# Patient Record
Sex: Female | Born: 1975 | Race: White | Hispanic: No | Marital: Married | Smoking: Never smoker
Health system: Southern US, Community
[De-identification: ages and names within clinical notes are randomized; demographics above are authoritative.]

## PROBLEM LIST (undated history)

## (undated) DIAGNOSIS — D649 Anemia, unspecified: Secondary | ICD-10-CM

## (undated) DIAGNOSIS — N6091 Unspecified benign mammary dysplasia of right breast: Secondary | ICD-10-CM

## (undated) DIAGNOSIS — A6000 Herpesviral infection of urogenital system, unspecified: Secondary | ICD-10-CM

## (undated) DIAGNOSIS — N6489 Other specified disorders of breast: Secondary | ICD-10-CM

## (undated) HISTORY — PX: FRACTURE SURGERY: SHX138

## (undated) HISTORY — DX: Herpesviral infection of urogenital system, unspecified: A60.00

---

## 1998-09-12 DIAGNOSIS — R87629 Unspecified abnormal cytological findings in specimens from vagina: Secondary | ICD-10-CM

## 1998-09-12 HISTORY — DX: Unspecified abnormal cytological findings in specimens from vagina: R87.629

## 1999-04-13 HISTORY — PX: OTHER SURGICAL HISTORY: SHX169

## 2007-01-13 ENCOUNTER — Inpatient Hospital Stay: Payer: Self-pay

## 2010-09-12 HISTORY — PX: DILATION AND CURETTAGE OF UTERUS: SHX78

## 2010-09-20 ENCOUNTER — Ambulatory Visit: Payer: Self-pay

## 2010-09-21 ENCOUNTER — Ambulatory Visit: Payer: Self-pay

## 2011-10-29 ENCOUNTER — Inpatient Hospital Stay: Payer: Self-pay

## 2011-10-29 LAB — CBC WITH DIFFERENTIAL/PLATELET
Basophil %: 0.3 %
Eosinophil %: 0.5 %
HGB: 11.8 g/dL — ABNORMAL LOW (ref 12.0–16.0)
MCH: 27.5 pg (ref 26.0–34.0)
MCV: 83 fL (ref 80–100)
Monocyte #: 0.4 10*3/uL (ref 0.0–0.7)
RBC: 4.28 10*6/uL (ref 3.80–5.20)

## 2014-11-04 LAB — HM PAP SMEAR

## 2019-11-29 ENCOUNTER — Encounter: Payer: Self-pay | Admitting: Obstetrics and Gynecology

## 2019-12-02 ENCOUNTER — Other Ambulatory Visit (HOSPITAL_COMMUNITY)
Admission: RE | Admit: 2019-12-02 | Discharge: 2019-12-02 | Disposition: A | Discharge status: Home / Self Care | Source: Ambulatory Visit | Attending: Obstetrics and Gynecology | Admitting: Obstetrics and Gynecology

## 2019-12-02 ENCOUNTER — Other Ambulatory Visit: Payer: Self-pay

## 2019-12-02 ENCOUNTER — Encounter: Payer: Self-pay | Admitting: Obstetrics and Gynecology

## 2019-12-02 ENCOUNTER — Ambulatory Visit (INDEPENDENT_AMBULATORY_CARE_PROVIDER_SITE_OTHER): Admitting: Obstetrics and Gynecology

## 2019-12-02 VITALS — BP 114/80 | Ht 64.0 in | Wt 155.0 lb

## 2019-12-02 DIAGNOSIS — Z01419 Encounter for gynecological examination (general) (routine) without abnormal findings: Secondary | ICD-10-CM

## 2019-12-02 DIAGNOSIS — Z124 Encounter for screening for malignant neoplasm of cervix: Secondary | ICD-10-CM

## 2019-12-02 DIAGNOSIS — Z1151 Encounter for screening for human papillomavirus (HPV): Secondary | ICD-10-CM | POA: Diagnosis present

## 2019-12-02 DIAGNOSIS — Z1231 Encounter for screening mammogram for malignant neoplasm of breast: Secondary | ICD-10-CM

## 2019-12-02 DIAGNOSIS — A6004 Herpesviral vulvovaginitis: Secondary | ICD-10-CM

## 2019-12-02 MED ORDER — VALACYCLOVIR HCL 500 MG PO TABS: 500.0000 mg | ORAL_TABLET | Freq: Two times a day (BID) | ORAL | 1 refills | Status: AC

## 2019-12-02 NOTE — Progress Notes (Signed)
PCP:  Patient, No Pcp Per   Chief Complaint  Patient presents with  . Gynecologic Exam    HPI:      [name] is a 44 y.o. No obstetric history on file. who LMP was Patient's last menstrual period was 11/22/2019 (approximate)., presents today for her NP > 3 yrs annual examination.  Her menses are regular every 28-30 days, lasting 5-7 days.  Dysmenorrhea mild, occurring first 1-2 days of flow. She does not have intermenstrual bleeding.  Sex activity: single partner, contraception - vasectomy.  Last Pap: November 04, 2014  Results were: no abnormalities /neg HPV DNA  Hx of STDs: HSV (takes valtrex prn sx, needs Rx RF), HPV on pap  Last mammogram: 12/14/15  Results were: normal--routine follow-up in 12 months There is no FH of breast cancer. There is no FH of ovarian cancer. The patient does not do self-breast exams.  Tobacco use: The patient denies current or previous tobacco use. Alcohol use: social drinker No drug use.  Exercise: moderately active  She does get adequate calcium and Vitamin D in her diet. No recent labs but has NP PCP appt next month.  Past Medical History:  Diagnosis Date  . Abnormal vaginal Pap smear 2000  . Herpes genitalis     Past Surgical History:  Procedure Laterality Date  . Cervical cryotherapy  04/13/1999  . DILATION AND CURETTAGE OF UTERUS  09/2010    Family History  Problem Relation Age of Onset  . Diabetes Maternal Grandmother   . Diabetes Other   . Colon cancer Paternal Grandfather        35-72    Social History   Socioeconomic History  . Marital status: Married    Spouse name: Not on file  . Number of children: Not on file  . Years of education: Not on file  . Highest education level: Not on file  Occupational History  . Not on file  Tobacco Use  . Smoking status: Never Smoker  . Smokeless tobacco: Never Used  Substance and Sexual Activity  . Alcohol use: Yes  . Drug use: Never  . Sexual activity: Yes    Birth  control/protection: None, Surgical    Comment: Vasectomy  Other Topics Concern  . Not on file  Social History Narrative  . Not on file   Social Determinants of Health   Financial Resource Strain:   . Difficulty of Paying Living Expenses:   Food Insecurity:   . Worried About Charity fundraiser in the Last Year:   . Arboriculturist in the Last Year:   Transportation Needs:   . Film/video editor (Medical):   Marland Kitchen Lack of Transportation (Non-Medical):   Physical Activity:   . Days of Exercise per Week:   . Minutes of Exercise per Session:   Stress:   . Feeling of Stress :   Social Connections:   . Frequency of Communication with Friends and Family:   . Frequency of Social Gatherings with Friends and Family:   . Attends Religious Services:   . Active Member of Clubs or Organizations:   . Attends Archivist Meetings:   Marland Kitchen Marital Status:   Intimate Partner Violence:   . Fear of Current or Ex-Partner:   . Emotionally Abused:   Marland Kitchen Physically Abused:   . Sexually Abused:      Current Outpatient Medications:  .  valACYclovir (VALTREX) 500 MG tablet, Take 1 tablet (500 mg total) by  mouth 2 (two) times daily for 3 days. Prn sx, Disp: 30 tablet, Rfl: 1     ROS:  Review of Systems  Constitutional: Negative for fatigue, fever and unexpected weight change.  Respiratory: Negative for cough, shortness of breath and wheezing.   Cardiovascular: Negative for chest pain, palpitations and leg swelling.  Gastrointestinal: Negative for blood in stool, constipation, diarrhea, nausea and vomiting.  Endocrine: Negative for cold intolerance, heat intolerance and polyuria.  Genitourinary: Negative for dyspareunia, dysuria, flank pain, frequency, genital sores, hematuria, menstrual problem, pelvic pain, urgency, vaginal bleeding, vaginal discharge and vaginal pain.  Musculoskeletal: Negative for back pain, joint swelling and myalgias.  Skin: Negative for rash.  Neurological:  Negative for dizziness, syncope, light-headedness, numbness and headaches.  Hematological: Negative for adenopathy.  Psychiatric/Behavioral: Negative for agitation, confusion, sleep disturbance and suicidal ideas. The patient is not nervous/anxious.   BREAST: No symptoms   Objective: BP 114/80   Ht 5\' 4"  (1.626 m)   Wt 155 lb (70.3 kg)   LMP 11/22/2019 (Approximate)   BMI 26.61 kg/m    Physical Exam Constitutional:      Appearance: She is well-developed.  Genitourinary:     Vulva, vagina, cervix, uterus, right adnexa and left adnexa normal.     No vulval lesion or tenderness noted.     No vaginal discharge, erythema or tenderness.     No cervical polyp.     Uterus is not enlarged or tender.     No right or left adnexal mass present.     Right adnexa not tender.     Left adnexa not tender.  Neck:     Thyroid: No thyromegaly.  Cardiovascular:     Rate and Rhythm: Normal rate and regular rhythm.     Heart sounds: Normal heart sounds. No murmur.  Pulmonary:     Effort: Pulmonary effort is normal.     Breath sounds: Normal breath sounds.  Chest:     Breasts:        Right: No mass, nipple discharge, skin change or tenderness.        Left: No mass, nipple discharge, skin change or tenderness.  Abdominal:     Palpations: Abdomen is soft.     Tenderness: There is no abdominal tenderness. There is no guarding.  Musculoskeletal:        General: Normal range of motion.     Cervical back: Normal range of motion.  Neurological:     General: No focal deficit present.     Mental Status: She is alert and oriented to person, place, and time.     Cranial Nerves: No cranial nerve deficit.  Skin:    General: Skin is warm and dry.  Psychiatric:        Mood and Affect: Mood normal.        Behavior: Behavior normal.        Thought Content: Thought content normal.        Judgment: Judgment normal.  Vitals reviewed.     Assessment/Plan: Encounter for annual routine gynecological  examination  Cervical cancer screening - Plan: Cytology - PAP  Screening for HPV (human papillomavirus) - Plan: Cytology - PAP  Encounter for screening mammogram for malignant neoplasm of breast - Plan: MM 3D SCREEN BREAST BILATERAL; pt to sched mammo  Herpes simplex vulvovaginitis - Plan: valACYclovir (VALTREX) 500 MG tablet; Rx RF.   Meds ordered this encounter  Medications  . valACYclovir (VALTREX) 500 MG tablet    Sig:  Take 1 tablet (500 mg total) by mouth 2 (two) times daily for 3 days. Prn sx    Dispense:  30 tablet    Refill:  1    Order Specific Question:   Supervising Provider    Answer:   Gae Dry [id number]             GYN counsel mammography screening, adequate intake of calcium and vitamin D, diet and exercise     F/U  Return in about 1 year (around 12/01/2020).  Alicia B. Copland, PA-C 12/02/2019 3:35 PM

## 2019-12-02 NOTE — Patient Instructions (Signed)
I value your feedback and entrusting us with your care. If you get a [place] patient survey, I would appreciate you taking the time to let us know about your experience today. Thank you!  As of August 22, 2019, your lab results will be released to your MyChart immediately, before I even have a chance to see them. Please give me time to review them and contact you if there are any abnormalities. Thank you for your patience.   [facility]: [phone]  [facility]: [phone]  

## 2019-12-04 LAB — CYTOLOGY - PAP
Comment: NEGATIVE
Diagnosis: NEGATIVE
High risk HPV: NEGATIVE

## 2019-12-16 ENCOUNTER — Ambulatory Visit
Admission: RE | Admit: 2019-12-16 | Discharge: 2019-12-16 | Disposition: A | Discharge status: Home / Self Care | Source: Ambulatory Visit | Attending: Obstetrics and Gynecology | Admitting: Obstetrics and Gynecology

## 2019-12-16 DIAGNOSIS — Z1231 Encounter for screening mammogram for malignant neoplasm of breast: Secondary | ICD-10-CM | POA: Insufficient documentation

## 2019-12-25 ENCOUNTER — Encounter: Payer: Self-pay | Admitting: Obstetrics and Gynecology

## 2020-01-07 ENCOUNTER — Other Ambulatory Visit: Payer: Self-pay | Admitting: Orthopedic Surgery

## 2020-01-07 DIAGNOSIS — M25531 Pain in right wrist: Secondary | ICD-10-CM

## 2020-01-07 DIAGNOSIS — G8929 Other chronic pain: Secondary | ICD-10-CM

## 2020-01-27 ENCOUNTER — Ambulatory Visit
Admission: RE | Admit: 2020-01-27 | Discharge: 2020-01-27 | Disposition: A | Discharge status: Home / Self Care | Source: Ambulatory Visit | Attending: Orthopedic Surgery | Admitting: Orthopedic Surgery

## 2020-01-27 ENCOUNTER — Other Ambulatory Visit: Payer: Self-pay

## 2020-01-27 DIAGNOSIS — M25531 Pain in right wrist: Secondary | ICD-10-CM

## 2020-01-27 DIAGNOSIS — R936 Abnormal findings on diagnostic imaging of limbs: Secondary | ICD-10-CM | POA: Diagnosis not present

## 2020-01-27 DIAGNOSIS — G8929 Other chronic pain: Secondary | ICD-10-CM | POA: Diagnosis not present

## 2020-01-27 MED ORDER — IOHEXOL 180 MG/ML  SOLN
2.0000 mL | Freq: Once | INTRAMUSCULAR | Status: AC
  Administered 2020-01-27: 2 mL

## 2020-01-27 MED ORDER — LIDOCAINE HCL (PF) 1 % IJ SOLN
5.0000 mL | Freq: Once | INTRAMUSCULAR | Status: AC
  Administered 2020-01-27: 5 mL
  Filled 2020-01-27: qty 5

## 2020-01-27 MED ORDER — SODIUM CHLORIDE (PF) 0.9 % IJ SOLN
10.0000 mL | Freq: Once | INTRAMUSCULAR | Status: AC
  Administered 2020-01-27: 10 mL

## 2020-01-27 MED ORDER — GADOBUTROL 1 MMOL/ML IV SOLN
0.0500 mL | Freq: Once | INTRAVENOUS | Status: AC
  Administered 2020-01-27: 0.05 mL

## 2020-03-09 ENCOUNTER — Other Ambulatory Visit: Payer: Self-pay

## 2020-03-09 ENCOUNTER — Ambulatory Visit (INDEPENDENT_AMBULATORY_CARE_PROVIDER_SITE_OTHER): Admitting: Dermatology

## 2020-03-09 DIAGNOSIS — L578 Other skin changes due to chronic exposure to nonionizing radiation: Secondary | ICD-10-CM | POA: Diagnosis not present

## 2020-03-09 DIAGNOSIS — L820 Inflamed seborrheic keratosis: Secondary | ICD-10-CM

## 2020-03-09 DIAGNOSIS — D485 Neoplasm of uncertain behavior of skin: Secondary | ICD-10-CM

## 2020-03-09 DIAGNOSIS — L918 Other hypertrophic disorders of the skin: Secondary | ICD-10-CM

## 2020-03-09 DIAGNOSIS — L814 Other melanin hyperpigmentation: Secondary | ICD-10-CM

## 2020-03-09 DIAGNOSIS — Z86018 Personal history of other benign neoplasm: Secondary | ICD-10-CM

## 2020-03-09 HISTORY — DX: Personal history of other benign neoplasm: Z86.018

## 2020-03-09 NOTE — Patient Instructions (Signed)

## 2020-03-09 NOTE — Progress Notes (Signed)
Follow-Up Visit   Subjective  Penny Tapia is a 44 y.o. female who presents for the following: Other (Spot of ant neck ~20. it sometimes itches and get irritated by jewelry.) and Skin Tag (of bilat axilla. Get irritated with shaving and by clothing.).  The following portions of the chart were reviewed this encounter and updated as appropriate:  Tobacco  Allergies  Meds  Problems  Med Hx  Surg Hx  Fam Hx      Review of Systems:  No other skin or systemic complaints except as noted in HPI or Assessment and Plan.  Objective  Well appearing patient in no apparent distress; mood and affect are within normal limits.  A focused examination was performed including face, neck, chest, bilat axilla. Relevant physical exam findings are noted in the Assessment and Plan.  Objective  Left Anterior Neck: 0.6 cm flesh colored papule  Objective  Right medial side of foot: 0.5 cm irregular brown macule  Objective  Left Axilla, Right Axilla: Fleshy, skin-colored pedunculated papules.    Objective  Right Breast: Erythematous keratotic or waxy stuck-on papule or plaque.    Assessment & Plan    Actinic Damage - diffuse scaly erythematous macules with underlying dyspigmentation - Recommend daily broad spectrum sunscreen SPF 30+ to sun-exposed areas, reapply every 2 hours as needed.  - Call for new or changing lesions.  Lentigines - Scattered tan macules - Discussed due to sun exposure - Benign, observe - Call for any changes  Neoplasm of uncertain behavior of skin (2) Left Anterior Neck  Epidermal / dermal shaving  Lesion diameter (cm):  0.6 Informed consent: discussed and consent obtained   Timeout: patient name, date of birth, surgical site, and procedure verified   Procedure prep:  Patient was prepped and draped in usual sterile fashion Prep type:  Isopropyl alcohol Anesthesia: the lesion was anesthetized in a standard fashion   Anesthetic:  1% lidocaine w/  epinephrine 1-100,000 buffered w/ 8.4% NaHCO3 Instrument used: flexible razor blade   Hemostasis achieved with: pressure, aluminum chloride and electrodesiccation   Outcome: patient tolerated procedure well   Post-procedure details: sterile dressing applied and wound care instructions given   Dressing type: bandage and petrolatum   Additional details:  Post treatment defect - 1.0 cm  Specimen 1 - Surgical pathology Differential Diagnosis:Nevus vs dysplastic nevus  Check Margins: No 0.6 cm flesh colored papule  Right medial side of foot  Epidermal / dermal shaving  Lesion diameter (cm):  0.5 Informed consent: discussed and consent obtained   Timeout: patient name, date of birth, surgical site, and procedure verified   Procedure prep:  Patient was prepped and draped in usual sterile fashion Prep type:  Isopropyl alcohol Anesthesia: the lesion was anesthetized in a standard fashion   Anesthetic:  1% lidocaine w/ epinephrine 1-100,000 buffered w/ 8.4% NaHCO3 Instrument used: flexible razor blade   Hemostasis achieved with: pressure, aluminum chloride and electrodesiccation   Outcome: patient tolerated procedure well   Post-procedure details: sterile dressing applied and wound care instructions given   Dressing type: bandage and petrolatum   Additional details:  Post treatment defect - 0.9 cm  Specimen 2 - Surgical pathology Differential Diagnosis: Nevus vs dysplastic nevus Check Margins: No 0.5 cm irregular brown macule  Skin tag (2) Left Axilla; Right Axilla  Epidermal / dermal shaving - Left Axilla, Right Axilla  Informed consent: discussed and consent obtained   Patient was prepped and draped in usual sterile fashion: Area prepped with  alcohol. Anesthesia: the lesion was anesthetized in a standard fashion   Anesthetic:  1% lidocaine w/ epinephrine 1-100,000 buffered w/ 8.4% NaHCO3 Instrument used: scissors   Hemostasis achieved with: pressure, aluminum chloride and  electrodesiccation   Outcome: patient tolerated procedure well   Post-procedure details: wound care instructions given    Inflamed seborrheic keratosis Right Breast  Destruction of lesion - Right Breast Complexity: simple   Destruction method: cryotherapy   Informed consent: discussed and consent obtained   Timeout:  patient name, date of birth, surgical site, and procedure verified Lesion destroyed using liquid nitrogen: Yes   Region frozen until ice ball extended beyond lesion: Yes   Outcome: patient tolerated procedure well with no complications   Post-procedure details: wound care instructions given    Return if symptoms worsen or fail to improve.   I, Ashok Cordia, CMA, am acting as scribe for Sarina Ser, MD .  Documentation: I have reviewed the above documentation for accuracy and completeness, and I agree with the above.  Sarina Ser, MD

## 2020-03-12 ENCOUNTER — Telehealth: Payer: Self-pay

## 2020-03-12 ENCOUNTER — Encounter: Payer: Self-pay | Admitting: Dermatology

## 2020-03-12 NOTE — Telephone Encounter (Signed)
Patient informed of pathology results and appointment scheduled.  °

## 2020-03-12 NOTE — Telephone Encounter (Signed)
-----   Message from Ralene Bathe, MD sent at 03/12/2020  8:56 AM EDT ----- 1. Skin , left anterior neck, shave MELANOCYTIC NEVUS, INTRADERMAL TYPE 2. Skin , right medial side of foot JUNCTIONAL DYSPLASTIC MELANOCYTIC NEVUS WITH MODERATE TO SEVERE ATYPIA, PERIPHERAL MARGIN INVOLVED, SEE DESCRIPTION  1- benign mole 2- Moderate to severe dysplastic nevus Schedule for shave removal

## 2020-06-02 ENCOUNTER — Ambulatory Visit (INDEPENDENT_AMBULATORY_CARE_PROVIDER_SITE_OTHER): Admitting: Dermatology

## 2020-06-02 ENCOUNTER — Encounter: Payer: Self-pay | Admitting: Dermatology

## 2020-06-02 ENCOUNTER — Other Ambulatory Visit: Payer: Self-pay

## 2020-06-02 DIAGNOSIS — D1801 Hemangioma of skin and subcutaneous tissue: Secondary | ICD-10-CM | POA: Diagnosis not present

## 2020-06-02 DIAGNOSIS — D235 Other benign neoplasm of skin of trunk: Secondary | ICD-10-CM

## 2020-06-02 DIAGNOSIS — D239 Other benign neoplasm of skin, unspecified: Secondary | ICD-10-CM

## 2020-06-02 NOTE — Patient Instructions (Signed)

## 2020-06-02 NOTE — Progress Notes (Signed)
   Follow-Up Visit   Subjective  Penny Tapia is a 44 y.o. female who presents for the following: Dysplastic Nevus moderate to severe (R medial side of foot, bx 03/09/20). She also has spots on her trunk she would like checked.  The following portions of the chart were reviewed this encounter and updated as appropriate:  Tobacco  Allergies  Meds  Problems  Med Hx  Surg Hx  Fam Hx     Review of Systems:  No other skin or systemic complaints except as noted in HPI or Assessment and Plan.  Objective  Well appearing patient in no apparent distress; mood and affect are within normal limits.  A focused examination was performed including R medial side of foot. Relevant physical exam findings are noted in the Assessment and Plan.  Objective  R medial side of foot: Pink bx site 1.5cm  Objective  abdomen: Red papules.    Assessment & Plan  Dysplastic nevus R medial side of foot  Moderate to severe, bx proven  Shave removal today  Epidermal / dermal shaving - R medial side of foot  Lesion diameter (cm):  1.5 Informed consent: discussed and consent obtained   Timeout: patient name, date of birth, surgical site, and procedure verified   Procedure prep:  Patient was prepped and draped in usual sterile fashion Prep type:  Isopropyl alcohol Anesthesia: the lesion was anesthetized in a standard fashion   Anesthetic:  1% lidocaine w/ epinephrine 1-100,000 buffered w/ 8.4% NaHCO3 Instrument used: flexible razor blade   Hemostasis achieved with: pressure, aluminum chloride and electrodesiccation   Outcome: patient tolerated procedure well   Post-procedure details: sterile dressing applied and wound care instructions given   Dressing type: bandage, pressure dressing and bacitracin    Specimen 1 - Surgical pathology Differential Diagnosis: D48.5 Dysplastic Nevus Check Margins: yes Pink bx site 1.5cm [id number]  Hemangioma of skin abdomen  Benign, observe  Return in  about 6 months (around 11/30/2020) for TBSE, hx of dysplastic nevi.  I, Othelia Pulling, RMA, am acting as scribe for Sarina Ser, MD .  Documentation: I have reviewed the above documentation for accuracy and completeness, and I agree with the above.  Sarina Ser, MD

## 2020-06-03 ENCOUNTER — Encounter: Payer: Self-pay | Admitting: Dermatology

## 2020-06-08 ENCOUNTER — Telehealth: Payer: Self-pay

## 2020-06-08 NOTE — Telephone Encounter (Signed)
Discussed biopsy results with pt  °

## 2020-06-08 NOTE — Telephone Encounter (Signed)
-----   Message from Ralene Bathe, MD sent at 06/05/2020  6:08 PM EDT ----- Skin , right medial side of foot NO RESIDUAL DYSPLASTIC NEVUS, MARGINS FREE  Moderate to severe dysplastic nevus Margins clear No further treatment at this time Recheck next visit

## 2020-12-03 NOTE — Patient Instructions (Signed)
I value your feedback and you entrusting us with your care. If you get a [place] patient survey, I would appreciate you taking the time to let us know about your experience today. Thank you!  [facility]: [phone]      

## 2020-12-03 NOTE — Progress Notes (Signed)
PCP:  [place] Pink, MD   Chief Complaint  Patient presents with  . Gynecologic Exam    No concerns    HPI:      [name] is a 45 y.o. No obstetric history on file. who LMP was Patient's last menstrual period was 11/13/2020 (approximate)., presents today for her annual examination.  Her menses are regular every 28-30 days, lasting 5-7 days.  Dysmenorrhea mild, occurring first 1-2 days of flow. She does not have intermenstrual bleeding.  Sex activity: single partner, contraception - vasectomy.  Last Pap: 12/02/19 Results were: no abnormalities /neg HPV DNA  Hx of STDs: HSV a few times yearly (takes valtrex prn sx, needs Rx RF), HPV on pap  Last mammogram: 12/16/19 Results were: normal--routine follow-up in 12 months There is no FH of breast cancer. There is no FH of ovarian cancer. The patient does not do self-breast exams.  Tobacco use: The patient denies current or previous tobacco use. Alcohol use: social drinker No drug use.  Exercise: moderately active  She does get adequate calcium and Vitamin D in her diet. Labs with PCP  Past Medical History:  Diagnosis Date  . Abnormal vaginal Pap smear 2000  . Herpes genitalis   . Hx of dysplastic nevus 03/09/2020   R medial side of foot. Moderate to severe atypia, peripheral margin involved. Excised 06/02/2020. Margins free.    Past Surgical History:  Procedure Laterality Date  . Cervical cryotherapy  04/13/1999  . DILATION AND CURETTAGE OF UTERUS  09/2010    Family History  Problem Relation Age of Onset  . Diabetes Maternal Grandmother   . Diabetes Other   . Colon cancer Paternal Grandfather        54-72  . Breast cancer Neg Hx     Social History   Socioeconomic History  . Marital status: Married    Spouse name: Not on file  . Number of children: Not on file  . Years of education: Not on file  . Highest education level: Not on file  Occupational History  . Not on file  Tobacco Use  . Smoking status:  Never Smoker  . Smokeless tobacco: Never Used  Vaping Use  . Vaping Use: Never used  Substance and Sexual Activity  . Alcohol use: Yes  . Drug use: Never  . Sexual activity: Yes    Birth control/protection: None, Surgical    Comment: Vasectomy  Other Topics Concern  . Not on file  Social History Narrative  . Not on file   Social Determinants of Health   Financial Resource Strain: Not on file  Food Insecurity: Not on file  Transportation Needs: Not on file  Physical Activity: Not on file  Stress: Not on file  Social Connections: Not on file  Intimate Partner Violence: Not on file     Current Outpatient Medications:  .  Cholecalciferol 25 MCG (1000 UT) tablet, Take by mouth., Disp: , Rfl:  .  Multiple Vitamin (MULTI-VITAMIN) tablet, Take 1 tablet by mouth daily., Disp: , Rfl:  .  valACYclovir (VALTREX) 500 MG tablet, Take 1 tablet (500 mg total) by mouth 2 (two) times daily., Disp: 30 tablet, Rfl: 1     ROS:  Review of Systems  Constitutional: Negative for fatigue, fever and unexpected weight change.  Respiratory: Negative for cough, shortness of breath and wheezing.   Cardiovascular: Negative for chest pain, palpitations and leg swelling.  Gastrointestinal: Negative for blood in stool, constipation, diarrhea, nausea and vomiting.  Endocrine: Negative for cold intolerance, heat intolerance and polyuria.  Genitourinary: Negative for dyspareunia, dysuria, flank pain, frequency, genital sores, hematuria, menstrual problem, pelvic pain, urgency, vaginal bleeding, vaginal discharge and vaginal pain.  Musculoskeletal: Negative for back pain, joint swelling and myalgias.  Skin: Negative for rash.  Neurological: Negative for dizziness, syncope, light-headedness, numbness and headaches.  Hematological: Negative for adenopathy.  Psychiatric/Behavioral: Negative for agitation, confusion, sleep disturbance and suicidal ideas. The patient is not nervous/anxious.   BREAST: No  symptoms   Objective: BP 102/80   Ht 5\' 4"  (1.626 m)   Wt 156 lb (70.8 kg)   LMP 11/13/2020 (Approximate)   BMI 26.78 kg/m    Physical Exam Constitutional:      Appearance: She is well-developed.  Genitourinary:     Vulva normal.     Right Labia: No rash, tenderness or lesions.    Left Labia: No tenderness, lesions or rash.    No vaginal discharge, erythema or tenderness.      Right Adnexa: not tender and no mass present.    Left Adnexa: not tender and no mass present.    No cervical friability or polyp.     Uterus is not enlarged or tender.  Breasts:     Right: No mass, nipple discharge, skin change or tenderness.     Left: No mass, nipple discharge, skin change or tenderness.    Neck:     Thyroid: No thyromegaly.  Cardiovascular:     Rate and Rhythm: Normal rate and regular rhythm.     Heart sounds: Normal heart sounds. No murmur heard.   Pulmonary:     Effort: Pulmonary effort is normal.     Breath sounds: Normal breath sounds.  Abdominal:     Palpations: Abdomen is soft.     Tenderness: There is no abdominal tenderness. There is no guarding or rebound.  Musculoskeletal:        General: Normal range of motion.     Cervical back: Normal range of motion.  Lymphadenopathy:     Cervical: No cervical adenopathy.  Neurological:     General: No focal deficit present.     Mental Status: She is alert and oriented to person, place, and time.     Cranial Nerves: No cranial nerve deficit.  Skin:    General: Skin is warm and dry.  Psychiatric:        Mood and Affect: Mood normal.        Behavior: Behavior normal.        Thought Content: Thought content normal.        Judgment: Judgment normal.  Vitals reviewed.     Assessment/Plan: Encounter for annual routine gynecological examination  Encounter for screening mammogram for malignant neoplasm of breast - Plan: MM 3D SCREEN BREAST BILATERAL; pt to sched mammo  Herpes simplex vulvovaginitis - Plan: valACYclovir  (VALTREX) 500 MG tablet; Rx RF prn sx.   Meds ordered this encounter  Medications  . valACYclovir (VALTREX) 500 MG tablet    Sig: Take 1 tablet (500 mg total) by mouth 2 (two) times daily.    Dispense:  30 tablet    Refill:  1    Order Specific Question:   Supervising Provider    Answer:   Gae Dry [[id number]             GYN counsel mammography screening, adequate intake of calcium and vitamin D, diet and exercise     F/U  Return in about 1  year (around 12/07/2021).  Ravenna Legore B. Jerel Sardina, PA-C 12/07/2020 11:51 AM

## 2020-12-07 ENCOUNTER — Other Ambulatory Visit: Payer: Self-pay

## 2020-12-07 ENCOUNTER — Encounter: Admitting: Dermatology

## 2020-12-07 ENCOUNTER — Encounter: Payer: Self-pay | Admitting: Obstetrics and Gynecology

## 2020-12-07 ENCOUNTER — Ambulatory Visit (INDEPENDENT_AMBULATORY_CARE_PROVIDER_SITE_OTHER): Admitting: Obstetrics and Gynecology

## 2020-12-07 VITALS — BP 102/80 | Ht 64.0 in | Wt 156.0 lb

## 2020-12-07 DIAGNOSIS — A6004 Herpesviral vulvovaginitis: Secondary | ICD-10-CM

## 2020-12-07 DIAGNOSIS — Z01419 Encounter for gynecological examination (general) (routine) without abnormal findings: Secondary | ICD-10-CM

## 2020-12-07 DIAGNOSIS — Z1231 Encounter for screening mammogram for malignant neoplasm of breast: Secondary | ICD-10-CM | POA: Diagnosis not present

## 2020-12-07 MED ORDER — VALACYCLOVIR HCL 500 MG PO TABS: 500.0000 mg | ORAL_TABLET | Freq: Two times a day (BID) | ORAL | 1 refills | Status: DC

## 2020-12-21 ENCOUNTER — Ambulatory Visit
Admission: RE | Admit: 2020-12-21 | Discharge: 2020-12-21 | Disposition: A | Discharge status: Home / Self Care | Source: Ambulatory Visit | Attending: Obstetrics and Gynecology | Admitting: Obstetrics and Gynecology

## 2020-12-21 ENCOUNTER — Other Ambulatory Visit: Payer: Self-pay

## 2020-12-21 DIAGNOSIS — Z1231 Encounter for screening mammogram for malignant neoplasm of breast: Secondary | ICD-10-CM | POA: Insufficient documentation

## 2021-01-27 ENCOUNTER — Other Ambulatory Visit: Payer: Self-pay

## 2021-01-27 ENCOUNTER — Encounter: Payer: Self-pay | Admitting: Dermatology

## 2021-01-27 ENCOUNTER — Ambulatory Visit (INDEPENDENT_AMBULATORY_CARE_PROVIDER_SITE_OTHER): Admitting: Dermatology

## 2021-01-27 DIAGNOSIS — L578 Other skin changes due to chronic exposure to nonionizing radiation: Secondary | ICD-10-CM

## 2021-01-27 DIAGNOSIS — Z1283 Encounter for screening for malignant neoplasm of skin: Secondary | ICD-10-CM

## 2021-01-27 DIAGNOSIS — L308 Other specified dermatitis: Secondary | ICD-10-CM

## 2021-01-27 DIAGNOSIS — D229 Melanocytic nevi, unspecified: Secondary | ICD-10-CM

## 2021-01-27 DIAGNOSIS — L814 Other melanin hyperpigmentation: Secondary | ICD-10-CM

## 2021-01-27 DIAGNOSIS — L219 Seborrheic dermatitis, unspecified: Secondary | ICD-10-CM | POA: Diagnosis not present

## 2021-01-27 DIAGNOSIS — Z86018 Personal history of other benign neoplasm: Secondary | ICD-10-CM | POA: Diagnosis not present

## 2021-01-27 DIAGNOSIS — D1800 Hemangioma unspecified site: Secondary | ICD-10-CM

## 2021-01-27 DIAGNOSIS — L821 Other seborrheic keratosis: Secondary | ICD-10-CM

## 2021-01-27 NOTE — Progress Notes (Signed)
Follow-Up Visit   Subjective  Penny Tapia is a 45 y.o. female who presents for the following: Annual Exam (Mole check ) and Rash (Pt c/o rash on her scalp and eyes that will come and go, treating with Ketoconazole shampoo with a fair response ). Hx of Dysplastic nevus right foot.  The patient presents for Total-Body Skin Exam (TBSE) for skin cancer screening and mole check.  The following portions of the chart were reviewed this encounter and updated as appropriate:   Tobacco  Allergies  Meds  Problems  Med Hx  Surg Hx  Fam Hx     Review of Systems:  No other skin or systemic complaints except as noted in HPI or Assessment and Plan.  Objective  Well appearing patient in no apparent distress; mood and affect are within normal limits.  A full examination was performed including scalp, head, eyes, ears, nose, lips, neck, chest, axillae, abdomen, back, buttocks, bilateral upper extremities, bilateral lower extremities, hands, feet, fingers, toes, fingernails, and toenails. All findings within normal limits unless otherwise noted below.  Objective  Head - Anterior (Face): Pink patches with greasy scale.   Objective  trunk, exts: Clear today    Assessment & Plan  Seborrheic dermatitis Head - Anterior (Face) Seborrheic Dermatitis  -  is a chronic persistent rash characterized by pinkness and scaling most commonly of the mid face but also can occur on the scalp (dandruff), ears; mid chest and mid back. It tends to be exacerbated by stress and cooler weather.  People who have neurologic disease may experience new onset or exacerbation of existing seborrheic dermatitis.  The condition is not curable but treatable and can be controlled.   Cont Ketoconazole shampoo alternating with otc Zinc shampoo, over the counter Tar shampoo  May consider adding Clobetasol shampoo if no better, pt will call if she need this prescription   Other eczema trunk, exts Atopic dermatitis (eczema)  is a chronic, relapsing, pruritic condition that can significantly affect quality of life. It is often associated with allergic rhinitis and/or asthma and can require treatment with topical medications, phototherapy, or in severe cases a biologic medication called Dupixent in older children and adults.   Controlled on Cerave cream   May consider topical steroid cream if any flares, pt will return to the clinic if she need a topical steroid  Skin cancer screening   Lentigines - Scattered tan macules - Due to sun exposure - Benign-appering, observe - Recommend daily broad spectrum sunscreen SPF 30+ to sun-exposed areas, reapply every 2 hours as needed. - Call for any changes  Seborrheic Keratoses - Stuck-on, waxy, tan-brown papules and/or plaques  - Benign-appearing - Discussed benign etiology and prognosis. - Observe - Call for any changes  Melanocytic Nevi - Tan-brown and/or pink-flesh-colored symmetric macules and papules - Benign appearing on exam today - Observation - Call clinic for new or changing moles - Recommend daily use of broad spectrum spf 30+ sunscreen to sun-exposed areas.   Hemangiomas - Red papules - Discussed benign nature - Observe - Call for any changes  Actinic Damage - Chronic condition, secondary to cumulative UV/sun exposure - diffuse scaly erythematous macules with underlying dyspigmentation - Recommend daily broad spectrum sunscreen SPF 30+ to sun-exposed areas, reapply every 2 hours as needed.  - Staying in the shade or wearing long sleeves, sun glasses (UVA+UVB protection) and wide brim hats (4-inch brim around the entire circumference of the hat) are also recommended for sun protection.  -  Call for new or changing lesions.  History of Dysplastic Nevi Right foot 2021  - No evidence of recurrence today - Recommend regular full body skin exams - Recommend daily broad spectrum sunscreen SPF 30+ to sun-exposed areas, reapply every 2 hours as  needed.  - Call if any new or changing lesions are noted between office visits  Skin cancer screening performed today.  Return in about 1 year (around 01/27/2022) for TBSE, hx of Dysplastic nevus .  IMarye Round, CMA, am acting as scribe for Sarina Ser, MD .  Documentation: I have reviewed the above documentation for accuracy and completeness, and I agree with the above.  Sarina Ser, MD

## 2021-01-27 NOTE — Patient Instructions (Addendum)
  Recommend over the counter Tar shampoo, Zinc shampoo, alternating with Ketoconazole shampoo  If you have any questions or concerns for your doctor, please call our main line at [phone] and press option 4 to reach your doctor's medical assistant. If no one answers, please leave a voicemail as directed and we will return your call as soon as possible. Messages left after 4 pm will be answered the following business day.   You may also send [place] a message via [place]. We typically respond to MyChart messages within 1-2 business days.  For prescription refills, please ask your pharmacy to contact our office. Our fax number is [phone].  If you have an urgent issue when the clinic is closed that cannot wait until the next business day, you can page your doctor at the number below.    Please note that while we do our best to be available for urgent issues outside of office hours, we are not available 24/7.   If you have an urgent issue and are unable to reach [place], you may choose to seek medical care at your doctor's office, retail clinic, urgent care center, or emergency room.  If you have a medical emergency, please immediately call 911 or go to the emergency department.  Pager Numbers  - Dr. Nehemiah Massed: [phone]  - Dr. Laurence Ferrari: [phone]  - Dr. Nicole Kindred: [phone]  In the event of inclement weather, please call our main line at [phone] for an update on the status of any delays or closures.  Dermatology Medication Tips: Please keep the boxes that topical medications come in in order to help keep track of the instructions about where and how to use these. Pharmacies typically print the medication instructions only on the boxes and not directly on the medication tubes.   If your medication is too expensive, please contact our office at [phone] option 4 or send [place] a message through [place].   We are unable to tell what your co-pay for medications will be in advance as  this is different depending on your insurance coverage. However, we may be able to find a substitute medication at lower cost or fill out paperwork to get insurance to cover a needed medication.   If a prior authorization is required to get your medication covered by your insurance company, please allow [place] 1-2 business days to complete this process.  Drug prices often vary depending on where the prescription is filled and some pharmacies may offer cheaper prices.  The website www.goodrx.com contains coupons for medications through different pharmacies. The prices here do not account for what the cost may be with help from insurance (it may be cheaper with your insurance), but the website can give you the price if you did not use any insurance.  - You can print the associated coupon and take it with your prescription to the pharmacy.  - You may also stop by our office during regular business hours and pick up a GoodRx coupon card.  - If you need your prescription sent electronically to a different pharmacy, notify our office through [facility] or by phone at [phone] option 4.

## 2021-02-05 ENCOUNTER — Encounter: Payer: Self-pay | Admitting: Dermatology

## 2021-12-08 NOTE — Progress Notes (Signed)
? ?PCP:  [place] Pink, MD ? ? ?Chief Complaint  ?Patient presents with  ? Gynecologic Exam  ?  No concerns  ? ? ?HPI: ?     [name] is a 46 y.o. No obstetric history on file. who LMP was Patient's last menstrual period was 11/12/2021 (approximate)., presents today for her annual examination.  Her menses are regular every 28-30 days, lasting 5-7 days.  Dysmenorrhea mild, occurring first 1-2 days of flow. She does not have intermenstrual bleeding. ? ?Sex activity: single partner, contraception - vasectomy.  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 ?Last mammogram: 12/21/20 Results were: normal--routine follow-up in 12 months ?There is no FH of breast cancer. There is no FH of ovarian cancer. The patient does not do self-breast exams. ? ?Tobacco use: The patient denies current or previous tobacco use. ?Alcohol use: social drinker ?No drug use.  ?Exercise: moderately active ? ?She does get adequate calcium and Vitamin D in her diet. ?Labs with PCP ? ?Past Medical History:  ?Diagnosis Date  ? Abnormal vaginal Pap smear 2000  ? Herpes genitalis   ? Hx of dysplastic nevus 03/09/2020  ? R medial side of foot. Moderate to severe atypia, peripheral margin involved. Excised 06/02/2020. Margins free.  ? ? ?Past Surgical History:  ?Procedure Laterality Date  ? Cervical cryotherapy  04/13/1999  ? DILATION AND CURETTAGE OF UTERUS  09/2010  ? ? ?Family History  ?Problem Relation Age of Onset  ? Diabetes Maternal Grandmother   ? Colon cancer Paternal Grandfather   ?     70-72  ? Breast cancer Neg Hx   ? ? ?Social History  ? ?Socioeconomic History  ? Marital status: Married  ?  Spouse name: Not on file  ? Number of children: Not on file  ? Years of education: Not on file  ? Highest education level: Not on file  ?Occupational History  ? Not on file  ?Tobacco  Use  ? Smoking status: Never  ? Smokeless tobacco: Never  ?Vaping Use  ? Vaping Use: Never used  ?Substance and Sexual Activity  ? Alcohol use: Yes  ? Drug use: Never  ? Sexual activity: Yes  ?  Birth control/protection: None, Surgical  ?  Comment: Vasectomy  ?Other Topics Concern  ? Not on file  ?Social History Narrative  ? Not on file  ? ?Social Determinants of Health  ? ?Financial Resource Strain: Not on file  ?Food Insecurity: Not on file  ?Transportation Needs: Not on file  ?Physical Activity: Not on file  ?Stress: Not on file  ?Social Connections: Not on file  ?Intimate Partner Violence: Not on file  ? ? ? ?Current Outpatient Medications:  ?  Cholecalciferol 25 MCG (1000 UT) tablet, Take by mouth., Disp: , Rfl:  ?  ketoconazole (NIZORAL) 2 % shampoo, Apply topically., Disp: , Rfl:  ?  Multiple Vitamin (MULTI-VITAMIN) tablet, Take 1 tablet by mouth daily., Disp: , Rfl:  ?  valACYclovir (VALTREX) 500 MG tablet, Take 1 tablet (500 mg total) by mouth 2 (two) times daily., Disp: 30 tablet, Rfl: 2 ? ? ? ? ?ROS: ? ?Review of Systems  ?Constitutional:  Negative for fatigue, fever and unexpected weight change.  ?Respiratory:  Negative for cough, shortness of breath and wheezing.   ?Cardiovascular:  Negative for chest pain, palpitations and leg swelling.  ?Gastrointestinal:  Negative for blood in stool, constipation, diarrhea, nausea and vomiting.  ?Endocrine: Negative for cold intolerance, heat intolerance and polyuria.  ?Genitourinary:  Negative for dyspareunia, dysuria, flank pain, frequency, genital sores, hematuria, menstrual problem, pelvic pain, urgency, vaginal bleeding, vaginal discharge and vaginal pain.  ?Musculoskeletal:  Negative for back pain, joint swelling and myalgias.  ?Skin:  Negative for rash.  ?Neurological:  Negative for dizziness, syncope, light-headedness, numbness and headaches.  ?Hematological:  Negative for adenopathy.  ?Psychiatric/Behavioral:  Negative for agitation, confusion, sleep  disturbance and suicidal ideas. The patient is not nervous/anxious.  BREAST: No symptoms ? ? ?Objective: ?BP 98/64   Ht '5\' 4"'$  (1.626 m)   Wt 158 lb (71.7 kg)   LMP 11/12/2021 (Approximate)   BMI 27.12 kg/m?  ? ? ?Physical Exam ?Constitutional:   ?   Appearance: She is well-developed.  ?Genitourinary:  ?   Vulva normal.  ?   Right Labia: No rash, tenderness or lesions. ?   Left Labia: No tenderness, lesions or rash. ?   No vaginal discharge, erythema or tenderness.  ? ?   Right Adnexa: not tender and no mass present. ?   Left Adnexa: not tender and no mass present. ?   No cervical friability or polyp.  ?   Uterus is not enlarged or tender.  ?Breasts: ?   Right: No mass, nipple discharge, skin change or tenderness.  ?   Left: No mass, nipple discharge, skin change or tenderness.  ?Neck:  ?   Thyroid: No thyromegaly.  ?Cardiovascular:  ?   Rate and Rhythm: Normal rate and regular rhythm.  ?   Heart sounds: Normal heart sounds. No murmur heard. ?Pulmonary:  ?   Effort: Pulmonary effort is normal.  ?   Breath sounds: Normal breath sounds.  ?Abdominal:  ?   Palpations: Abdomen is soft.  ?   Tenderness: There is no abdominal tenderness. There is no guarding or rebound.  ?Musculoskeletal:     ?   General: Normal range of motion.  ?   Cervical back: Normal range of motion.  ?Lymphadenopathy:  ?   Cervical: No cervical adenopathy.  ?Neurological:  ?   General: No focal deficit present.  ?   Mental Status: She is alert and oriented to person, place, and time.  ?   Cranial Nerves: No cranial nerve deficit.  ?Skin: ?   General: Skin is warm and dry.  ?Psychiatric:     ?   Mood and Affect: Mood normal.     ?   Behavior: Behavior normal.     ?   Thought Content: Thought content normal.     ?   Judgment: Judgment normal.  ?Vitals reviewed.  ? ? ?Assessment/Plan: ?Encounter for annual routine gynecological examination ? ?Encounter for screening mammogram for malignant neoplasm of breast - Plan: MM 3D SCREEN BREAST BILATERAL; pt  to schedule mammo ? ?Herpes simplex vulvovaginitis - Plan: valACYclovir (VALTREX) 500 MG tablet; Rx RF.  ? ? ?Meds ordered this encounter  ?Medications  ? valACYclovir (VALTREX) 500 MG tablet  ?  Sig: Take 1 tablet (500 mg total) by mouth 2 (two) times daily.  ?  Dispense:  30 tablet  ?  Refill:  2  ?  Order Specific Question:   Supervising Provider  ?  AnswerGae Dry [[id number]]  ? ?          ?GYN counsel mammography screening,  adequate intake of calcium and vitamin D, diet and exercise ? ? ?  F/U ? Return in about 1 year (around 12/10/2022). ? ?Karrine Kluttz B. Wolf Boulay, PA-C ?12/09/2021 ?11:05 AM ?

## 2021-12-09 ENCOUNTER — Encounter: Payer: Self-pay | Admitting: Obstetrics and Gynecology

## 2021-12-09 ENCOUNTER — Ambulatory Visit (INDEPENDENT_AMBULATORY_CARE_PROVIDER_SITE_OTHER): Admitting: Obstetrics and Gynecology

## 2021-12-09 VITALS — BP 98/64 | Ht 64.0 in | Wt 158.0 lb

## 2021-12-09 DIAGNOSIS — Z01419 Encounter for gynecological examination (general) (routine) without abnormal findings: Secondary | ICD-10-CM

## 2021-12-09 DIAGNOSIS — Z1231 Encounter for screening mammogram for malignant neoplasm of breast: Secondary | ICD-10-CM

## 2021-12-09 DIAGNOSIS — A6004 Herpesviral vulvovaginitis: Secondary | ICD-10-CM | POA: Diagnosis not present

## 2021-12-09 MED ORDER — VALACYCLOVIR HCL 500 MG PO TABS: 500.0000 mg | ORAL_TABLET | Freq: Two times a day (BID) | ORAL | 2 refills | Status: DC

## 2021-12-09 NOTE — Patient Instructions (Signed)
I value your feedback and you entrusting us with your care. If you get a [facility] patient survey, I would appreciate you taking the time to let us know about your experience today. Thank you! ? ? ?

## 2022-01-17 ENCOUNTER — Ambulatory Visit
Admission: RE | Admit: 2022-01-17 | Discharge: 2022-01-17 | Disposition: A | Discharge status: Home / Self Care | Source: Ambulatory Visit | Attending: Obstetrics and Gynecology | Admitting: Obstetrics and Gynecology

## 2022-01-17 DIAGNOSIS — Z1231 Encounter for screening mammogram for malignant neoplasm of breast: Secondary | ICD-10-CM | POA: Diagnosis present

## 2022-01-31 ENCOUNTER — Ambulatory Visit (INDEPENDENT_AMBULATORY_CARE_PROVIDER_SITE_OTHER): Admitting: Dermatology

## 2022-01-31 DIAGNOSIS — D229 Melanocytic nevi, unspecified: Secondary | ICD-10-CM

## 2022-01-31 DIAGNOSIS — L308 Other specified dermatitis: Secondary | ICD-10-CM

## 2022-01-31 DIAGNOSIS — L821 Other seborrheic keratosis: Secondary | ICD-10-CM

## 2022-01-31 DIAGNOSIS — Z86018 Personal history of other benign neoplasm: Secondary | ICD-10-CM

## 2022-01-31 DIAGNOSIS — Z1283 Encounter for screening for malignant neoplasm of skin: Secondary | ICD-10-CM

## 2022-01-31 DIAGNOSIS — L219 Seborrheic dermatitis, unspecified: Secondary | ICD-10-CM | POA: Diagnosis not present

## 2022-01-31 DIAGNOSIS — L578 Other skin changes due to chronic exposure to nonionizing radiation: Secondary | ICD-10-CM

## 2022-01-31 DIAGNOSIS — D1800 Hemangioma unspecified site: Secondary | ICD-10-CM

## 2022-01-31 DIAGNOSIS — L814 Other melanin hyperpigmentation: Secondary | ICD-10-CM

## 2022-01-31 MED ORDER — KETOCONAZOLE 2 % EX SHAM: MEDICATED_SHAMPOO | CUTANEOUS | 11 refills | Status: DC

## 2022-01-31 NOTE — Patient Instructions (Addendum)
For seb derm at face and scalp  Cont Ketoconazole shampoo use at least 3 x weekly alternating with otc Zinc shampoo, over the counter Tar shampoo  Increase ketoconazole shampoo use as a face wash  as a face wash 3 x weekly.   Can also use over the counter hydrocortisone 1 % cream when needed to affected areas of face.       Melanoma ABCDEs  Melanoma is the most dangerous type of skin cancer, and is the leading cause of death from skin disease.  You are more likely to develop melanoma if you: Have light-colored skin, light-colored eyes, or red or blond hair Spend a lot of time in the sun Tan regularly, either outdoors or in a tanning bed Have had blistering sunburns, especially during childhood Have a close family member who has had a melanoma Have atypical moles or large birthmarks  Early detection of melanoma is key since treatment is typically straightforward and cure rates are extremely high if we catch it early.   The first sign of melanoma is often a change in a mole or a new dark spot.  The ABCDE system is a way of remembering the signs of melanoma.  A for asymmetry:  The two halves do not match. B for border:  The edges of the growth are irregular. C for color:  A mixture of colors are present instead of an even brown color. D for diameter:  Melanomas are usually (but not always) greater than 44m - the size of a pencil eraser. E for evolution:  The spot keeps changing in size, shape, and color.  Please check your skin once per month between visits. You can use a small mirror in front and a large mirror behind you to keep an eye on the back side or your body.   If you see any new or changing lesions before your next follow-up, please call to schedule a visit.  Please continue daily skin protection including broad spectrum sunscreen SPF 30+ to sun-exposed areas, reapplying every 2 hours as needed when you're outdoors.   Staying in the shade or wearing long sleeves, sun  glasses (UVA+UVB protection) and wide brim hats (4-inch brim around the entire circumference of the hat) are also recommended for sun protection.    If You Need Anything After Your Visit  If you have any questions or concerns for your doctor, please call our main line at [phone] press option 4 to reach your doctor's medical assistant. If no one answers, please leave a voicemail as directed and we will return your call as soon as possible. Messages left after 4 pm will be answered the following business day.   You may also send [place] message via [place] We typically respond to MyChart messages within 1-2 business days.  For prescription refills, please ask your pharmacy to contact our office. Our fax number is [phone]  If you have an urgent issue when the clinic is closed that cannot wait until the next business day, you can page your doctor at the number below.    Please note that while we do our best to be available for urgent issues outside of office hours, we are not available 24/7.   If you have an urgent issue and are unable to reach [place] you may choose to seek medical care at your doctor's office, retail clinic, urgent care center, or emergency room.  If you have a medical emergency, please immediately call 911 or go to  the emergency department.  Pager Numbers  - Dr. Nehemiah Massed: [phone]  - Dr. Laurence Ferrari: [phone]  - Dr. Nicole Kindred: [phone]  In the event of inclement weather, please call our main line at [phone] for an update on the status of any delays or closures.  Dermatology Medication Tips: Please keep the boxes that topical medications come in in order to help keep track of the instructions about where and how to use these. Pharmacies typically print the medication instructions only on the boxes and not directly on the medication tubes.   If your medication is too expensive, please contact our office at [phone] option 4 or send [place] a message  through [place].   We are unable to tell what your co-pay for medications will be in advance as this is different depending on your insurance coverage. However, we may be able to find a substitute medication at lower cost or fill out paperwork to get insurance to cover a needed medication.   If a prior authorization is required to get your medication covered by your insurance company, please allow [place] 1-2 business days to complete this process.  Drug prices often vary depending on where the prescription is filled and some pharmacies may offer cheaper prices.  The website www.goodrx.com contains coupons for medications through different pharmacies. The prices here do not account for what the cost may be with help from insurance (it may be cheaper with your insurance), but the website can give you the price if you did not use any insurance.  - You can print the associated coupon and take it with your prescription to the pharmacy.  - You may also stop by our office during regular business hours and pick up a GoodRx coupon card.  - If you need your prescription sent electronically to a different pharmacy, notify our office through [facility] or by phone at [phone] option 4.     Si Usted Necesita Algo Despus de Su Visita  Tambin puede enviarnos un mensaje a travs de Pharmacist, community. Por lo general respondemos a los mensajes de MyChart en el transcurso de 1 a 2 das hbiles.  Para renovar recetas, por favor pida a su farmacia que se ponga en contacto con nuestra oficina. Harland Dingwall de fax es [place] [phone].  Si tiene un asunto urgente cuando la clnica est cerrada y que no puede esperar hasta el siguiente da hbil, puede llamar/localizar a su doctor(a) al nmero que aparece a continuacin.   Por favor, tenga en cuenta que aunque hacemos todo lo posible para estar disponibles para asuntos urgentes fuera del horario de [place], no estamos disponibles las 24 horas del da, los 7 das de  la [place].   Si tiene un problema urgente y no puede comunicarse con nosotros, puede optar por buscar atencin mdica  en el consultorio de su doctor(a), en una clnica privada, en un centro de atencin urgente o en una sala de emergencias.  Si tiene Engineering geologist, por favor llame inmediatamente al 911 o vaya a la sala de emergencias.  Nmeros de bper  - Dr. Nehemiah Massed: [phone]  - Dra. Moye: [phone]  - Dra. Nicole Kindred: [phone]  En caso de inclemencias del [place], por favor llame a Johnsie Kindred principal al [phone] para una actualizacin sobre el [place] de cualquier retraso o cierre.  Consejos para la medicacin en dermatologa: Por favor, guarde las cajas en las que vienen los medicamentos de uso tpico para ayudarle a seguir las instrucciones sobre dnde y cmo usarlos.  Las farmacias generalmente imprimen las instrucciones del medicamento slo en las cajas y no directamente en los tubos del [place].   Si su medicamento es muy caro, por favor, pngase en contacto con Zigmund Daniel llamando al [phone] y presione la opcin 4 o envenos un mensaje a travs de Pharmacist, community.   No podemos decirle cul ser su copago por los medicamentos por adelantado ya que esto es diferente dependiendo de la cobertura de su seguro. Sin embargo, es posible que podamos encontrar un medicamento sustituto a Electrical engineer un formulario para que el seguro cubra el medicamento que se considera necesario.   Si se requiere una autorizacin previa para que su compaa de seguros [place] su medicamento, por favor permtanos de 1 a 2 das hbiles para completar este proceso.  Los precios de los medicamentos varan con frecuencia dependiendo del Environmental consultant de dnde se surte la receta y alguna farmacias pueden ofrecer precios ms baratos.  El sitio web www.goodrx.com tiene cupones para medicamentos de Airline pilot. Los precios aqu no tienen en cuenta lo que podra costar con la ayuda  del seguro (puede ser ms barato con su seguro), pero el sitio web puede darle el precio si no utiliz Research scientist (physical sciences).  - Puede imprimir el cupn correspondiente y llevarlo con su receta a la farmacia.  - Tambin puede pasar por nuestra oficina durante el horario de atencin regular y Charity fundraiser una tarjeta de cupones de GoodRx.  - Si necesita que su receta se enve electrnicamente a una farmacia diferente, informe a nuestra oficina a travs de MyChart de [place] o por telfono llamando al [phone] y presione la opcin 4.

## 2022-01-31 NOTE — Progress Notes (Signed)
Follow-Up Visit   Subjective  Penny Tapia is a 46 y.o. female who presents for the following: Annual Exam (Hx of dysplastic, hx of seb derm at face and scalp. Would like discuss seb derm at face and scalp. Rfs for ketoconazole shampoo. ). The patient presents for Total-Body Skin Exam (TBSE) for skin cancer screening and mole check.  The patient has spots, moles and lesions to be evaluated, some may be new or changing and the patient has concerns that these could be cancer.  The following portions of the chart were reviewed this encounter and updated as appropriate:  Tobacco  Allergies  Meds  Problems  Med Hx  Surg Hx  Fam Hx     Review of Systems: No other skin or systemic complaints except as noted in HPI or Assessment and Plan.  Objective  Well appearing patient in no apparent distress; mood and affect are within normal limits.  A full examination was performed including scalp, head, eyes, ears, nose, lips, neck, chest, axillae, abdomen, back, buttocks, bilateral upper extremities, bilateral lower extremities, hands, feet, fingers, toes, fingernails, and toenails. All findings within normal limits unless otherwise noted below.   Assessment & Plan  Seborrheic dermatitis face and scalp Seborrheic Dermatitis  -  is a chronic persistent rash characterized by pinkness and scaling most commonly of the mid face but also can occur on the scalp (dandruff), ears; mid chest, mid back and groin.  It tends to be exacerbated by stress and cooler weather.  People who have neurologic disease may experience new onset or exacerbation of existing seborrheic dermatitis.  The condition is not curable but treatable and can be controlled.   Cont Ketoconazole shampoo use at least 3 x weekly alternating with otc Zinc shampoo, over the counter Tar shampoo Continue using  ketoconazole shampoo as a face wash at face 3 x weekly  Can also use otc HC 1% cream at face as needed for scale.  Discussed will  send rx HC 2.5 % cream to use at face , if current treatments are not working.   ketoconazole (NIZORAL) 2 % shampoo - face and scalp apply topically x 3 times weekly to scalp and massage into scalp and leave in for 3 - 5 minutes before rinsing out. Can also use 3 x weekly as a face wash to aa of face  Other eczema trunk and extremities Atopic dermatitis (eczema) is a chronic, relapsing, pruritic condition that can significantly affect quality of life. It is often associated with allergic rhinitis and/or asthma and can require treatment with topical medications, phototherapy, or in severe cases a biologic medication called Dupixent in older children and adults.    Controlled on Cerave cream  May consider topical steroid cream if any flares, pt will return to the clinic if she need a topical steroid  Lentigines - Scattered tan macules - Due to sun exposure - Benign-appearing, observe - Recommend daily broad spectrum sunscreen SPF 30+ to sun-exposed areas, reapply every 2 hours as needed. - Call for any changes  Seborrheic Keratoses - Stuck-on, waxy, tan-brown papules and/or plaques  - Benign-appearing - Discussed benign etiology and prognosis. - Observe - Call for any changes  Melanocytic Nevi - Tan-brown and/or pink-flesh-colored symmetric macules and papules - Benign appearing on exam today - Observation - Call clinic for new or changing moles - Recommend daily use of broad spectrum spf 30+ sunscreen to sun-exposed areas.   Hemangiomas - Red papules - Discussed benign nature - Observe -  Call for any changes  Actinic Damage - Chronic condition, secondary to cumulative UV/sun exposure - diffuse scaly erythematous macules with underlying dyspigmentation - Recommend daily broad spectrum sunscreen SPF 30+ to sun-exposed areas, reapply every 2 hours as needed.  - Staying in the shade or wearing long sleeves, sun glasses (UVA+UVB protection) and wide brim hats (4-inch brim around  the entire circumference of the hat) are also recommended for sun protection.  - Call for new or changing lesions.  History of Dysplastic Nevi - No evidence of recurrence today at right medial side of foot (2021) - Recommend regular full body skin exams - Recommend daily broad spectrum sunscreen SPF 30+ to sun-exposed areas, reapply every 2 hours as needed.  - Call if any new or changing lesions are noted between office visits  Skin cancer screening performed today. Return in about 1 year (around 02/01/2023).  IRuthell Rummage, CMA, am acting as scribe for Sarina Ser, MD. Documentation: I have reviewed the above documentation for accuracy and completeness, and I agree with the above.  Sarina Ser, MD

## 2022-02-07 ENCOUNTER — Encounter: Payer: Self-pay | Admitting: Dermatology

## 2022-06-28 ENCOUNTER — Other Ambulatory Visit: Payer: Self-pay | Admitting: Orthopedic Surgery

## 2022-06-28 DIAGNOSIS — M2391 Unspecified internal derangement of right knee: Secondary | ICD-10-CM

## 2022-07-14 ENCOUNTER — Ambulatory Visit
Admission: RE | Admit: 2022-07-14 | Discharge: 2022-07-14 | Disposition: A | Discharge status: Home / Self Care | Source: Ambulatory Visit | Attending: Orthopedic Surgery | Admitting: Orthopedic Surgery

## 2022-07-14 DIAGNOSIS — M2391 Unspecified internal derangement of right knee: Secondary | ICD-10-CM

## 2023-01-12 ENCOUNTER — Ambulatory Visit: Payer: Self-pay | Admitting: Obstetrics and Gynecology

## 2023-02-01 ENCOUNTER — Ambulatory Visit (INDEPENDENT_AMBULATORY_CARE_PROVIDER_SITE_OTHER): Admitting: Dermatology

## 2023-02-01 VITALS — BP 101/69 | HR 68

## 2023-02-01 DIAGNOSIS — L820 Inflamed seborrheic keratosis: Secondary | ICD-10-CM

## 2023-02-01 DIAGNOSIS — L219 Seborrheic dermatitis, unspecified: Secondary | ICD-10-CM

## 2023-02-01 DIAGNOSIS — Z1283 Encounter for screening for malignant neoplasm of skin: Secondary | ICD-10-CM

## 2023-02-01 DIAGNOSIS — L821 Other seborrheic keratosis: Secondary | ICD-10-CM

## 2023-02-01 DIAGNOSIS — Z86018 Personal history of other benign neoplasm: Secondary | ICD-10-CM

## 2023-02-01 DIAGNOSIS — D229 Melanocytic nevi, unspecified: Secondary | ICD-10-CM

## 2023-02-01 DIAGNOSIS — Z7189 Other specified counseling: Secondary | ICD-10-CM

## 2023-02-01 DIAGNOSIS — A6004 Herpesviral vulvovaginitis: Secondary | ICD-10-CM | POA: Insufficient documentation

## 2023-02-01 DIAGNOSIS — D1801 Hemangioma of skin and subcutaneous tissue: Secondary | ICD-10-CM

## 2023-02-01 DIAGNOSIS — W908XXA Exposure to other nonionizing radiation, initial encounter: Secondary | ICD-10-CM

## 2023-02-01 DIAGNOSIS — X32XXXA Exposure to sunlight, initial encounter: Secondary | ICD-10-CM

## 2023-02-01 DIAGNOSIS — L918 Other hypertrophic disorders of the skin: Secondary | ICD-10-CM

## 2023-02-01 DIAGNOSIS — L578 Other skin changes due to chronic exposure to nonionizing radiation: Secondary | ICD-10-CM

## 2023-02-01 DIAGNOSIS — Z79899 Other long term (current) drug therapy: Secondary | ICD-10-CM

## 2023-02-01 DIAGNOSIS — L814 Other melanin hyperpigmentation: Secondary | ICD-10-CM

## 2023-02-01 MED ORDER — MOMETASONE FUROATE 0.1 % EX SOLN: CUTANEOUS | 6 refills | Status: DC

## 2023-02-01 MED ORDER — KETOCONAZOLE 2 % EX SHAM: MEDICATED_SHAMPOO | CUTANEOUS | 11 refills | Status: DC

## 2023-02-01 NOTE — Patient Instructions (Signed)
Due to recent changes in healthcare laws, you may see results of your pathology and/or laboratory studies on MyChart before the doctors have had a chance to review them. We understand that in some cases there may be results that are confusing or concerning to you. Please understand that not all results are received at the same time and often the doctors may need to interpret multiple results in order to provide you with the best plan of care or course of treatment. Therefore, we ask that you please give us 2 business days to thoroughly review all your results before contacting the office for clarification. Should we see a critical lab result, you will be contacted sooner.   If You Need Anything After Your Visit  If you have any questions or concerns for your doctor, please call our main line at [phone] and press option 4 to reach your doctor's medical assistant. If no one answers, please leave a voicemail as directed and we will return your call as soon as possible. Messages left after 4 pm will be answered the following business day.   You may also send us a message via MyChart. We typically respond to MyChart messages within 1-2 business days.  For prescription refills, please ask your pharmacy to contact our office. Our fax number is [phone].  If you have an urgent issue when the clinic is closed that cannot wait until the next business day, you can page your doctor at the number below.    Please note that while we do our best to be available for urgent issues outside of office hours, we are not available 24/7.   If you have an urgent issue and are unable to reach us, you may choose to seek medical care at your doctor's office, retail clinic, urgent care center, or emergency room.  If you have a medical emergency, please immediately call 911 or go to the emergency department.  Pager Numbers  - Dr. Kowalski: [phone]  - Dr. Moye: [phone]  - Dr. Stewart:  [phone]  In the event of inclement weather, please call our main line at [phone] for an update on the status of any delays or closures.  Dermatology Medication Tips: Please keep the boxes that topical medications come in in order to help keep track of the instructions about where and how to use these. Pharmacies typically print the medication instructions only on the boxes and not directly on the medication tubes.   If your medication is too expensive, please contact our office at [phone] option 4 or send us a message through MyChart.   We are unable to tell what your co-pay for medications will be in advance as this is different depending on your insurance coverage. However, we may be able to find a substitute medication at lower cost or fill out paperwork to get insurance to cover a needed medication.   If a prior authorization is required to get your medication covered by your insurance company, please allow us 1-2 business days to complete this process.  Drug prices often vary depending on where the prescription is filled and some pharmacies may offer cheaper prices.  The website www.goodrx.com contains coupons for medications through different pharmacies. The prices here do not account for what the cost may be with help from insurance (it may be cheaper with your insurance), but the website can give you the price if you did not use any insurance.  - You can print the associated coupon and take it with   your prescription to the pharmacy.  - You may also stop by our office during regular business hours and pick up a GoodRx coupon card.  - If you need your prescription sent electronically to a different pharmacy, notify our office through [facility] MyChart or by phone at [phone] option 4.     Si Usted Necesita Algo Despus de Su Visita  Tambin puede enviarnos un mensaje a travs de MyChart. Por lo general respondemos a los mensajes de MyChart en el transcurso de 1 a 2  das hbiles.  Para renovar recetas, por favor pida a su farmacia que se ponga en contacto con nuestra oficina. Nuestro nmero de fax es el [phone].  Si tiene un asunto urgente cuando la clnica est cerrada y que no puede esperar hasta el siguiente da hbil, puede llamar/localizar a su doctor(a) al nmero que aparece a continuacin.   Por favor, tenga en cuenta que aunque hacemos todo lo posible para estar disponibles para asuntos urgentes fuera del horario de oficina, no estamos disponibles las 24 horas del da, los 7 das de la semana.   Si tiene un problema urgente y no puede comunicarse con nosotros, puede optar por buscar atencin mdica  en el consultorio de su doctor(a), en una clnica privada, en un centro de atencin urgente o en una sala de emergencias.  Si tiene una emergencia mdica, por favor llame inmediatamente al 911 o vaya a la sala de emergencias.  Nmeros de bper  - Dr. Kowalski: [phone]  - Dra. Moye: [phone]  - Dra. Stewart: [phone]  En caso de inclemencias del tiempo, por favor llame a nuestra lnea principal al [phone] para una actualizacin sobre el estado de cualquier retraso o cierre.  Consejos para la medicacin en dermatologa: Por favor, guarde las cajas en las que vienen los medicamentos de uso tpico para ayudarle a seguir las instrucciones sobre dnde y cmo usarlos. Las farmacias generalmente imprimen las instrucciones del medicamento slo en las cajas y no directamente en los tubos del medicamento.   Si su medicamento es muy caro, por favor, pngase en contacto con nuestra oficina llamando al [phone] y presione la opcin 4 o envenos un mensaje a travs de MyChart.   No podemos decirle cul ser su copago por los medicamentos por adelantado ya que esto es diferente dependiendo de la cobertura de su seguro. Sin embargo, es posible que podamos encontrar un medicamento sustituto a menor costo o llenar un formulario para que el  seguro cubra el medicamento que se considera necesario.   Si se requiere una autorizacin previa para que su compaa de seguros cubra su medicamento, por favor permtanos de 1 a 2 das hbiles para completar este proceso.  Los precios de los medicamentos varan con frecuencia dependiendo del lugar de dnde se surte la receta y alguna farmacias pueden ofrecer precios ms baratos.  El sitio web www.goodrx.com tiene cupones para medicamentos de diferentes farmacias. Los precios aqu no tienen en cuenta lo que podra costar con la ayuda del seguro (puede ser ms barato con su seguro), pero el sitio web puede darle el precio si no utiliz ningn seguro.  - Puede imprimir el cupn correspondiente y llevarlo con su receta a la farmacia.  - Tambin puede pasar por nuestra oficina durante el horario de atencin regular y recoger una tarjeta de cupones de GoodRx.  - Si necesita que su receta se enve electrnicamente a una farmacia diferente, informe a nuestra oficina a travs de MyChart de Home   o por telfono llamando al [phone] y presione la opcin 4.  

## 2023-02-01 NOTE — Progress Notes (Signed)
Follow-Up Visit   Subjective  Penny Tapia is a 47 y.o. female who presents for the following: Skin Cancer Screening and Full Body Skin Exam, hx of Dysplastic nevus, patient would like to have cosmetic skin tags removed today.   The patient presents for Total-Body Skin Exam (TBSE) for skin cancer screening and mole check. The patient has spots, moles and lesions to be evaluated, some may be new or changing and the patient has concerns that these could be cancer.    The following portions of the chart were reviewed this encounter and updated as appropriate: medications, allergies, medical history  Review of Systems:  No other skin or systemic complaints except as noted in HPI or Assessment and Plan.  Objective  Well appearing patient in no apparent distress; mood and affect are within normal limits.  A full examination was performed including scalp, head, eyes, ears, nose, lips, neck, chest, axillae, abdomen, back, buttocks, bilateral upper extremities, bilateral lower extremities, hands, feet, fingers, toes, fingernails, and toenails. All findings within normal limits unless otherwise noted below.   Relevant physical exam findings are noted in the Assessment and Plan.  Mid Back x 3 (3) Stuck-on, waxy, tan-brown papule --Discussed benign etiology and prognosis.     Assessment & Plan   LENTIGINES, SEBORRHEIC KERATOSES, HEMANGIOMAS - Benign normal skin lesions - Benign-appearing - Call for any changes  MELANOCYTIC NEVI - Tan-brown and/or pink-flesh-colored symmetric macules and papules - Benign appearing on exam today - Observation - Call clinic for new or changing moles - Recommend daily use of broad spectrum spf 30+ sunscreen to sun-exposed areas.   ACTINIC DAMAGE - Chronic condition, secondary to cumulative UV/sun exposure - diffuse scaly erythematous macules with underlying dyspigmentation - Recommend daily broad spectrum sunscreen SPF 30+ to sun-exposed areas, reapply  every 2 hours as needed.  - Staying in the shade or wearing long sleeves, sun glasses (UVA+UVB protection) and wide brim hats (4-inch brim around the entire circumference of the hat) are also recommended for sun protection.  - Call for new or changing lesions.  Seborrheic dermatitis face and scalp Seborrheic Dermatitis  -  is a chronic persistent rash characterized by pinkness and scaling most commonly of the mid face but also can occur on the scalp (dandruff), ears; mid chest, mid back and groin.  It tends to be exacerbated by stress and cooler weather.  People who have neurologic disease may experience new onset or exacerbation of existing seborrheic dermatitis.  The condition is not curable but treatable and can be controlled.   Cont Ketoconazole shampoo use at least 3 x weekly alternating with otc Zinc shampoo, over the counter Tar shampoo Continue using  ketoconazole shampoo as a face wash at face 3 x weekly   Start Mometasone solution apply to affected skin 5 nights a week   HISTORY OF DYSPLASTIC NEVUS Right medial side of foot  No evidence of recurrence today Recommend regular full body skin exams Recommend daily broad spectrum sunscreen SPF 30+ to sun-exposed areas, reapply every 2 hours as needed.  Call if any new or changing lesions are noted between office visits   SKIN CANCER SCREENING PERFORMED TODAY.  Inflamed seborrheic keratosis (3) Mid Back x 3  Symptomatic, irritating, patient would like treated.   Destruction of lesion - Mid Back x 3 Complexity: simple   Destruction method: cryotherapy   Informed consent: discussed and consent obtained   Timeout:  patient name, date of birth, surgical site, and procedure verified Lesion  destroyed using liquid nitrogen: Yes   Region frozen until ice ball extended beyond lesion: Yes   Outcome: patient tolerated procedure well with no complications   Post-procedure details: wound care instructions given    Seborrheic  dermatitis  Related Medications ketoconazole (NIZORAL) 2 % shampoo apply topically x 3 times weekly to scalp and massage into scalp and leave in for 3 - 5 minutes before rinsing out. Can also use 3 x weekly as a face wash to aa of face   ACROCHORDONS (Skin Tags) - Removal desired by patient - Fleshy, skin-colored pedunculated papules - Benign appearing.  - Patient desires removal. Reviewed that this is not covered by insurance and they will be charged a cosmetic fee for removal. Patient signed non-covered consent.  - Prior to procedure, discussed risks of blister formation, small wound, skin dyspigmentation, or rare scar following cryotherapy.   Destruction Procedure Note Destruction method: cryotherapy   Informed consent: discussed and consent obtained   Lesion destroyed using liquid nitrogen: Yes   Outcome: patient tolerated procedure well with no complications   Post-procedure details: wound care instructions given   Locations: right arm, left arm  # of Lesions Treated: 15  Patient will pay out of pocket for today's procedure   Return in about 1 year (around 02/01/2024) for TBSE, hx of Dysplastic nevus .  IAngelique Holm, CMA, am acting as scribe for Armida Sans, MD .   Documentation: I have reviewed the above documentation for accuracy and completeness, and I agree with the above.  Armida Sans, MD

## 2023-02-01 NOTE — Progress Notes (Unsigned)
PCP:  Jerl Mina, MD   No chief complaint on file.   HPI:      [name] is a 47 y.o. No obstetric history on file. who LMP was No LMP recorded., presents today for her annual examination.  Her menses are regular every 28-30 days, lasting 5-7 days.  Dysmenorrhea mild, occurring first 1-2 days of flow. She does not have intermenstrual bleeding.  Sex activity: single partner, contraception - vasectomy.  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 There is no FH of ovarian cancer. The patient does not do self-breast exams.  Tobacco use: The patient denies current or previous tobacco use. Alcohol use: social drinker No drug use.  Exercise: moderately active  She does get adequate calcium and Vitamin D in her diet. Labs with PCP  Past Medical History:  Diagnosis Date   Abnormal vaginal Pap smear 2000   Herpes genitalis    Hx of dysplastic nevus 03/09/2020   R medial side of foot. Moderate to severe atypia, peripheral margin involved. Excised 06/02/2020. Margins free.    Past Surgical History:  Procedure Laterality Date   Cervical cryotherapy  04/13/1999   DILATION AND CURETTAGE OF UTERUS  09/2010    Family History  Problem Relation Age of Onset   Diabetes Maternal Grandmother    Colon cancer Paternal Grandfather        51-72   Breast cancer Neg Hx     Social History   Socioeconomic History   Marital status: Married    Spouse name: Not on file   Number of children: Not on file   Years of education: Not on file   Highest education level: Not on file  Occupational History   Not on file  Tobacco Use   Smoking status: Never   Smokeless tobacco: Never  Vaping Use   Vaping Use: Never used   Substance and Sexual Activity   Alcohol use: Yes   Drug use: Never   Sexual activity: Yes    Birth control/protection: None, Surgical    Comment: Vasectomy  Other Topics Concern   Not on file  Social History Narrative   Not on file   Social Determinants of Health   Financial Resource Strain: Not on file  Food Insecurity: Not on file  Transportation Needs: Not on file  Physical Activity: Not on file  Stress: Not on file  Social Connections: Not on file  Intimate Partner Violence: Not on file     Current Outpatient Medications:    Cholecalciferol 25 MCG (1000 UT) tablet, Take by mouth., Disp: , Rfl:    ketoconazole (NIZORAL) 2 % shampoo, apply topically x 3 times weekly to scalp and massage into scalp and leave in for 3 - 5 minutes before rinsing out. Can also use 3 x weekly as a face wash to aa of face, Disp: 120 mL, Rfl: 11   mometasone (ELOCON) 0.1 % lotion, Apply to affected skin 5 nights a week prn flares, Disp: 60 mL, Rfl: 6   Multiple Vitamin (MULTI-VITAMIN) tablet, Take 1 tablet by mouth daily., Disp: , Rfl:    valACYclovir (VALTREX) 500 MG tablet, Take 1 tablet (500 mg total) by mouth  2 (two) times daily., Disp: 30 tablet, Rfl: 2     ROS:  Review of Systems  Constitutional:  Negative for fatigue, fever and unexpected weight change.  Respiratory:  Negative for cough, shortness of breath and wheezing.   Cardiovascular:  Negative for chest pain, palpitations and leg swelling.  Gastrointestinal:  Negative for blood in stool, constipation, diarrhea, nausea and vomiting.  Endocrine: Negative for cold intolerance, heat intolerance and polyuria.  Genitourinary:  Negative for dyspareunia, dysuria, flank pain, frequency, genital sores, hematuria, menstrual problem, pelvic pain, urgency, vaginal bleeding, vaginal discharge and vaginal pain.  Musculoskeletal:  Negative for back pain, joint swelling and myalgias.  Skin:  Negative for rash.  Neurological:  Negative for  dizziness, syncope, light-headedness, numbness and headaches.  Hematological:  Negative for adenopathy.  Psychiatric/Behavioral:  Negative for agitation, confusion, sleep disturbance and suicidal ideas. The patient is not nervous/anxious.   BREAST: No symptoms   Objective: There were no vitals taken for this visit.   Physical Exam Constitutional:      Appearance: She is well-developed.  Genitourinary:     Vulva normal.     Right Labia: No rash, tenderness or lesions.    Left Labia: No tenderness, lesions or rash.    No vaginal discharge, erythema or tenderness.      Right Adnexa: not tender and no mass present.    Left Adnexa: not tender and no mass present.    No cervical friability or polyp.     Uterus is not enlarged or tender.  Breasts:    Right: No mass, nipple discharge, skin change or tenderness.     Left: No mass, nipple discharge, skin change or tenderness.  Neck:     Thyroid: No thyromegaly.  Cardiovascular:     Rate and Rhythm: Normal rate and regular rhythm.     Heart sounds: Normal heart sounds. No murmur heard. Pulmonary:     Effort: Pulmonary effort is normal.     Breath sounds: Normal breath sounds.  Abdominal:     Palpations: Abdomen is soft.     Tenderness: There is no abdominal tenderness. There is no guarding or rebound.  Musculoskeletal:        General: Normal range of motion.     Cervical back: Normal range of motion.  Lymphadenopathy:     Cervical: No cervical adenopathy.  Neurological:     General: No focal deficit present.     Mental Status: She is alert and oriented to person, place, and time.     Cranial Nerves: No cranial nerve deficit.  Skin:    General: Skin is warm and dry.  Psychiatric:        Mood and Affect: Mood normal.        Behavior: Behavior normal.        Thought Content: Thought content normal.        Judgment: Judgment normal.  Vitals reviewed.     Assessment/Plan: Encounter for annual routine gynecological  examination  Encounter for screening mammogram for malignant neoplasm of breast - Plan: MM 3D SCREEN BREAST BILATERAL; pt to schedule mammo  Herpes simplex vulvovaginitis - Plan: valACYclovir (VALTREX) 500 MG tablet; Rx RF.    No orders of the defined types were placed in this encounter.            GYN counsel mammography screening, adequate intake of calcium and vitamin D, diet and exercise     F/U  No follow-ups on file.  Tulsi Crossett B. Darla Mcdonald, PA-C  02/01/2023 9:20 PM

## 2023-02-02 ENCOUNTER — Ambulatory Visit (INDEPENDENT_AMBULATORY_CARE_PROVIDER_SITE_OTHER): Admitting: Obstetrics and Gynecology

## 2023-02-02 ENCOUNTER — Other Ambulatory Visit (HOSPITAL_COMMUNITY)
Admission: RE | Admit: 2023-02-02 | Discharge: 2023-02-02 | Disposition: A | Discharge status: Home / Self Care | Source: Ambulatory Visit | Attending: Obstetrics and Gynecology | Admitting: Obstetrics and Gynecology

## 2023-02-02 ENCOUNTER — Encounter: Payer: Self-pay | Admitting: Obstetrics and Gynecology

## 2023-02-02 VITALS — BP 108/70 | Ht 64.0 in | Wt 166.0 lb

## 2023-02-02 DIAGNOSIS — N939 Abnormal uterine and vaginal bleeding, unspecified: Secondary | ICD-10-CM

## 2023-02-02 DIAGNOSIS — Z1231 Encounter for screening mammogram for malignant neoplasm of breast: Secondary | ICD-10-CM

## 2023-02-02 DIAGNOSIS — Z1151 Encounter for screening for human papillomavirus (HPV): Secondary | ICD-10-CM | POA: Diagnosis present

## 2023-02-02 DIAGNOSIS — Z01419 Encounter for gynecological examination (general) (routine) without abnormal findings: Secondary | ICD-10-CM

## 2023-02-02 DIAGNOSIS — Z124 Encounter for screening for malignant neoplasm of cervix: Secondary | ICD-10-CM

## 2023-02-02 DIAGNOSIS — A6004 Herpesviral vulvovaginitis: Secondary | ICD-10-CM

## 2023-02-02 MED ORDER — VALACYCLOVIR HCL 500 MG PO TABS
500.0000 mg | ORAL_TABLET | Freq: Every day | ORAL | 2 refills | Status: DC
Start: 2023-02-02 — End: 2024-02-06

## 2023-02-02 NOTE — Patient Instructions (Addendum)
I value your feedback and you entrusting us with your care. If you get a [facility] patient survey, I would appreciate you taking the time to let us know about your experience today. Thank you!  [facility]: [phone]      

## 2023-02-09 LAB — CYTOLOGY - PAP
Comment: NEGATIVE
Diagnosis: NEGATIVE
High risk HPV: NEGATIVE

## 2023-02-14 ENCOUNTER — Encounter: Payer: Self-pay | Admitting: Dermatology

## 2023-02-17 ENCOUNTER — Ambulatory Visit
Admission: RE | Admit: 2023-02-17 | Discharge: 2023-02-17 | Disposition: A | Discharge status: Home / Self Care | Source: Ambulatory Visit | Attending: Obstetrics and Gynecology | Admitting: Obstetrics and Gynecology

## 2023-02-17 DIAGNOSIS — Z1231 Encounter for screening mammogram for malignant neoplasm of breast: Secondary | ICD-10-CM | POA: Insufficient documentation

## 2023-02-20 ENCOUNTER — Other Ambulatory Visit: Payer: Self-pay | Admitting: Obstetrics and Gynecology

## 2023-02-20 ENCOUNTER — Ambulatory Visit (INDEPENDENT_AMBULATORY_CARE_PROVIDER_SITE_OTHER)

## 2023-02-20 DIAGNOSIS — N939 Abnormal uterine and vaginal bleeding, unspecified: Secondary | ICD-10-CM | POA: Diagnosis not present

## 2023-02-20 DIAGNOSIS — R928 Other abnormal and inconclusive findings on diagnostic imaging of breast: Secondary | ICD-10-CM

## 2023-02-20 DIAGNOSIS — N6489 Other specified disorders of breast: Secondary | ICD-10-CM

## 2023-02-23 ENCOUNTER — Telehealth: Payer: Self-pay | Admitting: Obstetrics and Gynecology

## 2023-02-23 NOTE — Telephone Encounter (Signed)
Pt aware of GYN u/s results. EM=14 mm, possible endometrial polyp. Discussed EMB if BTB sx persist (had just started) vs BC/IUD in case sx perimenopausal. Pt to consider and f/u with mgmt.

## 2023-02-27 ENCOUNTER — Encounter: Payer: Self-pay | Admitting: Obstetrics and Gynecology

## 2023-02-27 ENCOUNTER — Ambulatory Visit
Admission: RE | Admit: 2023-02-27 | Discharge: 2023-02-27 | Disposition: A | Discharge status: Home / Self Care | Source: Ambulatory Visit | Attending: Obstetrics and Gynecology | Admitting: Obstetrics and Gynecology

## 2023-02-27 DIAGNOSIS — N6489 Other specified disorders of breast: Secondary | ICD-10-CM | POA: Insufficient documentation

## 2023-02-27 DIAGNOSIS — R928 Other abnormal and inconclusive findings on diagnostic imaging of breast: Secondary | ICD-10-CM | POA: Insufficient documentation

## 2023-02-28 ENCOUNTER — Other Ambulatory Visit: Payer: Self-pay | Admitting: Obstetrics and Gynecology

## 2023-02-28 DIAGNOSIS — N63 Unspecified lump in unspecified breast: Secondary | ICD-10-CM

## 2023-02-28 DIAGNOSIS — R928 Other abnormal and inconclusive findings on diagnostic imaging of breast: Secondary | ICD-10-CM

## 2023-03-06 ENCOUNTER — Ambulatory Visit
Admission: RE | Admit: 2023-03-06 | Discharge: 2023-03-06 | Disposition: A | Discharge status: Home / Self Care | Source: Ambulatory Visit | Attending: Obstetrics and Gynecology | Admitting: Obstetrics and Gynecology

## 2023-03-06 DIAGNOSIS — N63 Unspecified lump in unspecified breast: Secondary | ICD-10-CM | POA: Insufficient documentation

## 2023-03-06 DIAGNOSIS — R928 Other abnormal and inconclusive findings on diagnostic imaging of breast: Secondary | ICD-10-CM | POA: Insufficient documentation

## 2023-03-06 HISTORY — PX: BREAST BIOPSY: SHX20

## 2023-03-06 MED ORDER — LIDOCAINE-EPINEPHRINE 1 %-1:100000 IJ SOLN
5.0000 mL | Freq: Once | INTRAMUSCULAR | Status: AC
  Administered 2023-03-06: 5 mL
  Filled 2023-03-06: qty 5

## 2023-03-06 MED ORDER — LIDOCAINE HCL 1 % IJ SOLN
2.0000 mL | Freq: Once | INTRAMUSCULAR | Status: AC
  Administered 2023-03-06: 2 mL via INTRADERMAL
  Filled 2023-03-06: qty 2

## 2023-03-07 ENCOUNTER — Encounter: Payer: Self-pay | Admitting: *Deleted

## 2023-03-07 DIAGNOSIS — N6489 Other specified disorders of breast: Secondary | ICD-10-CM

## 2023-03-07 NOTE — Progress Notes (Signed)
Referral recieved from [facility] for benign breast mass.  Referral sent to Dr. Claudine Mouton per patient preference.  His office will call with the appointment.   No further needs at this time.

## 2023-03-08 NOTE — H&P (View-Only) (Signed)
Patient ID: Penny Tapia, female   DOB: 07/03/1976, 46 y.o.   MRN: [id number]  Chief Complaint: Complex sclerosing lesion right breast  History of Present Illness Penny Tapia is a 46 y.o. female with a new lateral right breast lesion noted on screening mammography, diagnostic mammography and ultrasound revealed the lesion that was biopsied under ultrasound guidance.  No prior history of palpable breast mass, skin changes or previous biopsy. She utilized oral birth control in the past,no family history, pre-menopausal, [id number].  Nursed her infants, no breast history otherwise.   Past Medical History Past Medical History:  Diagnosis Date   Abnormal vaginal Pap smear 2000   Herpes genitalis    Hx of dysplastic nevus 03/09/2020   R medial side of foot. Moderate to severe atypia, peripheral margin involved. Excised 06/02/2020. Margins free.      Past Surgical History:  Procedure Laterality Date   BREAST BIOPSY Right 03/06/2023   US Bx, Ribbon Clip, path pending   BREAST BIOPSY Right 03/06/2023   US RT BREAST BX W LOC DEV 1ST LESION IMG BX SPEC US GUIDE 03/06/2023 [facility]   Cervical cryotherapy  04/13/1999   DILATION AND CURETTAGE OF UTERUS  09/2010    No Known Allergies  Current Outpatient Medications  Medication Sig Dispense Refill   Cholecalciferol (VITAMIN D-1000 MAX ST) 25 MCG (1000 UT) tablet Take by mouth.     ketoconazole (NIZORAL) 2 % shampoo apply topically x 3 times weekly to scalp and massage into scalp and leave in for 3 - 5 minutes before rinsing out. Can also use 3 x weekly as a face wash to aa of face 120 mL 11   mometasone (ELOCON) 0.1 % lotion Apply to affected skin 5 nights a week prn flares 60 mL 6   Multiple Vitamin (MULTI-VITAMIN) tablet Take 1 tablet by mouth daily.     valACYclovir (VALTREX) 500 MG tablet Take 1 tablet (500 mg total) by mouth daily. Or BID for 3 days prn sx 90 tablet 2   No current facility-administered medications for this visit.     Family History Family History  Problem Relation Age of Onset   Diabetes Maternal Grandmother    Uterine cancer Maternal Grandmother    Colon cancer Paternal Grandfather        70-72   Breast cancer Neg Hx       Social History Social History   Tobacco Use   Smoking status: Never    Passive exposure: Never   Smokeless tobacco: Never  Vaping Use   Vaping Use: Never used  Substance Use Topics   Alcohol use: Yes   Drug use: Never        Review of Systems  Constitutional: Negative.   HENT: Negative.    Eyes: Negative.   Respiratory: Negative.    Cardiovascular: Negative.   Gastrointestinal: Negative.   Genitourinary: Negative.   Skin: Negative.   Neurological: Negative.   Psychiatric/Behavioral: Negative.       Physical Exam Blood pressure 104/74, pulse 76, temperature 98 F (36.7 C), height 5' 4" (1.626 m), weight 166 lb (75.3 kg), last menstrual period 02/27/2023, SpO2 98 %. Last Weight  Most recent update: 03/09/2023  2:44 PM    Weight  75.3 kg (166 lb)             CONSTITUTIONAL: Well developed, and nourished, appropriately responsive and aware without distress.   EYES: Sclera non-icteric.   EARS, NOSE, MOUTH AND THROAT:  The oropharynx is clear.   Oral mucosa is pink and moist.    Hearing is intact to voice.  NECK: Trachea is midline, and there is no jugular venous distension.  LYMPH NODES:  Lymph nodes in the neck are not appreciated. RESPIRATORY:  Lungs are clear, and breath sounds are equal bilaterally.  Normal respiratory effort without pathologic use of accessory muscles. CARDIOVASCULAR: Heart is regular in rate and rhythm.   Well perfused.  GI: The abdomen is  soft, nontender, and nondistended.  MUSCULOSKELETAL:  Symmetrical muscle tone appreciated in all four extremities.    SKIN: Skin turgor is normal. No pathologic skin lesions appreciated.  NEUROLOGIC:  Motor and sensation appear grossly normal.  Cranial nerves are grossly without  defect. PSYCH:  Alert and oriented to person, place and time. Affect is appropriate for situation.  Data Reviewed I have personally reviewed what is currently available of the patient's imaging, recent labs and medical records.   Labs:     Latest Ref Rng & Units 10/30/2011    7:58 AM 10/29/2011    9:15 AM  CBC  WBC 3.6 - 11.0 x10 3/mm 3  9.5   Hemoglobin 12.0 - 16.0 g/dL  11.8   Hematocrit 35.0 - 47.0 % 30.1  35.7   Platelets 150 - 440 x10 3/mm 3  217        No data to display         PATHOLOGY:  Diagnosis Breast, right, needle core biopsy, 2 o'clock, 9cmfn COMPLEX SCLEROSING LESION FIBROCYSTIC CHANGES INCLUDING USUAL DUCT HYPERPLASIA AND EXTENSIVE ADENOSIS AND SCLEROSING ADENOSIS MICROCALCIFICATIONS PRESENT IN COMPLEX SCLEROSING LESION NEGATIVE FOR ATYPIA AND CARCINOMA  Imaging: Radiological images reviewed:  CLINICAL DATA:  Right breast asymmetry with associated microcalcifications seen on most recent screening mammography.   EXAM: DIGITAL DIAGNOSTIC UNILATERAL RIGHT MAMMOGRAM WITH TOMOSYNTHESIS; ULTRASOUND RIGHT BREAST LIMITED   TECHNIQUE: Right digital diagnostic mammography and breast tomosynthesis was performed.; Targeted ultrasound examination of the right breast was performed   COMPARISON:  Previous exam(s).   ACR Breast Density Category b: There are scattered areas of fibroglandular density.   FINDINGS: Additional mammographic views of the right breast demonstrate persistent focal asymmetry with associated indeterminate microcalcifications measuring 1.4 x 1.4 x 2.3 cm., located in the upper inner quadrant of the right breast, far posterior depth.   Targeted right breast ultrasound is performed and demonstrates 2 o'clock 9 cm from the nipple vaguely defined hypoechoic lobulated mass containing microcalcifications measuring 1.4 x 0.4 x 1.0 cm. There is an apparent ductal extension. This finding corresponds to the mammographically seen focal  asymmetry/calcifications. There is no evidence of right axillary lymphadenopathy.   IMPRESSION: Right breast 2 o'clock indeterminate mass containing microcalcifications, for which ultrasound-guided core needle biopsy is recommended.   RECOMMENDATION: Ultrasound-guided core needle biopsy of the right breast.   I have discussed the findings and recommendations with the patient. If applicable, a reminder letter will be sent to the patient regarding the next appointment.   BI-RADS CATEGORY  4: Suspicious.     Electronically Signed   By: Dobrinka  Dimitrova M.D.   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  We reviewed the data noted below, and reviewed options including observation.  She would like to proceed with excisional biopsy, and she and   her husband had good questions, I believe were reasonably answered.  Without any guarantees ever expressed or implied.  Risks of surgery reviewed.  Radial scars -- Complex Sclerosing Lesion (CSL) Radial scars, also called complex sclerosing lesions, are a pathologic diagnosis, usually discovered incidentally when a breast mass or radiologic abnormality is removed or biopsied. Occasionally, radial scars are large enough to be detected on mammography as suspicious spiculated masses, which cannot be reliably differentiated from spiculated carcinoma by imaging alone. Radial scars are characterized microscopically by a fibroelastic core with radiating ducts and lobules. In general, surgical excision is recommended when radial scars or complex sclerosing lesions are diagnosed on core biopsy, based on series showing that 8 to 17 percent of surgical specimens at subsequent excision are positive for malignancy. These high upgrade rates  have led to the suggestion that these lesions may actually be premalignant lesions, progressing from scar to hyperplasia to carcinoma. This, however, is controversial, and more recent series suggest the presence of undiagnosed cancer is much lower, with upgrade rates in the range of 0.6 to 3.6 percent. The upgrade rate is even lower with large-volume (eg, vacuum-assisted) needle biopsy.   In a meta-analysis of over 3000 patients with radial scars, the upgrade rate after vacuum-assisted biopsy to invasive and in situ cancer was only 0 and 1 percent, respectively. In a study of 50 patients with radial scar who were followed by active surveillance, no patient progressed on interval imaging at 16 months. The current recommendations from the American Society of Breast Surgeons state that most radial scars "should be excised, although imaging follow-up is reasonable for small, image-detected radial scars that are completely removed or well-sampled with large-gauge devices and in the setting of imaging-pathology concordance".  No additional treatment beyond excision is needed for radial scars. The risk of subsequent breast cancer after excision in this population is small, and chemoprevention is not indicated.  Face-to-face time spent with the patient and accompanying care providers(if present) was 30 minutes, with more than 50% of the time spent counseling, educating, and coordinating care of the patient.    These notes generated with voice recognition software. I apologize for typographical errors.  Winta Barcelo M.D., FACS 03/09/2023, 3:26 PM     

## 2023-03-08 NOTE — Progress Notes (Unsigned)
Patient ID: Penny Tapia, female   DOB: June 19, 1976, 47 y.o.   MRN: [id number]  Chief Complaint: Complex sclerosing lesion right breast  History of Present Illness Penny Tapia is a 47 y.o. female with a new lateral right breast lesion noted on screening mammography, diagnostic mammography and ultrasound revealed the lesion that was biopsied under ultrasound guidance.  No prior history of palpable breast mass, skin changes or previous biopsy. She utilized oral birth control in the past,no family history, pre-menopausal, [id number].  Nursed her infants, no breast history otherwise.   Past Medical History Past Medical History:  Diagnosis Date   Abnormal vaginal Pap smear 2000   Herpes genitalis    Hx of dysplastic nevus 03/09/2020   R medial side of foot. Moderate to severe atypia, peripheral margin involved. Excised 06/02/2020. Margins free.      Past Surgical History:  Procedure Laterality Date   BREAST BIOPSY Right 03/06/2023   [place] Bx, Ribbon Clip, path pending   BREAST BIOPSY Right 03/06/2023   [place] RT BREAST BX W LOC DEV 1ST LESION IMG BX SPEC US GUIDE 03/06/2023 [facility]   Cervical cryotherapy  04/13/1999   DILATION AND CURETTAGE OF UTERUS  09/2010    No Known Allergies  Current Outpatient Medications  Medication Sig Dispense Refill   Cholecalciferol (VITAMIN D-1000 MAX ST) 25 MCG (1000 UT) tablet Take by mouth.     ketoconazole (NIZORAL) 2 % shampoo apply topically x 3 times weekly to scalp and massage into scalp and leave in for 3 - 5 minutes before rinsing out. Can also use 3 x weekly as a face wash to aa of face 120 mL 11   mometasone (ELOCON) 0.1 % lotion Apply to affected skin 5 nights a week prn flares 60 mL 6   Multiple Vitamin (MULTI-VITAMIN) tablet Take 1 tablet by mouth daily.     valACYclovir (VALTREX) 500 MG tablet Take 1 tablet (500 mg total) by mouth daily. Or BID for 3 days prn sx 90 tablet 2   No current facility-administered medications for this visit.     Family History Family History  Problem Relation Age of Onset   Diabetes Maternal Grandmother    Uterine cancer Maternal Grandmother    Colon cancer Paternal Grandfather        70-72   Breast cancer Neg Hx       Social History Social History   Tobacco Use   Smoking status: Never    Passive exposure: Never   Smokeless tobacco: Never  Vaping Use   Vaping Use: Never used  Substance Use Topics   Alcohol use: Yes   Drug use: Never        Review of Systems  Constitutional: Negative.   HENT: Negative.    Eyes: Negative.   Respiratory: Negative.    Cardiovascular: Negative.   Gastrointestinal: Negative.   Genitourinary: Negative.   Skin: Negative.   Neurological: Negative.   Psychiatric/Behavioral: Negative.       Physical Exam Blood pressure 104/74, pulse 76, temperature 98 F (36.7 C), height 5\' 4"  (1.626 m), weight 166 lb (75.3 kg), last menstrual period 02/27/2023, SpO2 98 %. Last Weight  Most recent update: 03/09/2023  2:44 PM    Weight  75.3 kg (166 lb)             CONSTITUTIONAL: Well developed, and nourished, appropriately responsive and aware without distress.   EYES: Sclera non-icteric.   EARS, NOSE, MOUTH AND THROAT:  The oropharynx is clear.  Oral mucosa is pink and moist.    Hearing is intact to voice.  NECK: Trachea is midline, and there is no jugular venous distension.  LYMPH NODES:  Lymph nodes in the neck are not appreciated. RESPIRATORY:  Lungs are clear, and breath sounds are equal bilaterally.  Normal respiratory effort without pathologic use of accessory muscles. CARDIOVASCULAR: Heart is regular in rate and rhythm.   Well perfused.  GI: The abdomen is  soft, nontender, and nondistended.  MUSCULOSKELETAL:  Symmetrical muscle tone appreciated in all four extremities.    SKIN: Skin turgor is normal. No pathologic skin lesions appreciated.  NEUROLOGIC:  Motor and sensation appear grossly normal.  Cranial nerves are grossly without  defect. PSYCH:  Alert and oriented to person, place and time. Affect is appropriate for situation.  Data Reviewed I have personally reviewed what is currently available of the patient's imaging, recent labs and medical records.   Labs:     Latest Ref Rng & Units 10/30/2011    7:58 AM 10/29/2011    9:15 AM  CBC  WBC 3.6 - 11.0 x10 3/mm 3  9.5   Hemoglobin 12.0 - 16.0 g/dL  40.3   Hematocrit 47.4 - 47.0 % 30.1  35.7   Platelets 150 - 440 x10 3/mm 3  217        No data to display         PATHOLOGY:  Diagnosis Breast, right, needle core biopsy, 2 o'clock, 9cmfn COMPLEX SCLEROSING LESION FIBROCYSTIC CHANGES INCLUDING USUAL DUCT HYPERPLASIA AND EXTENSIVE ADENOSIS AND SCLEROSING ADENOSIS MICROCALCIFICATIONS PRESENT IN COMPLEX SCLEROSING LESION NEGATIVE FOR ATYPIA AND CARCINOMA  Imaging: Radiological images reviewed:  CLINICAL DATA:  Right breast asymmetry with associated microcalcifications seen on most recent screening mammography.   EXAM: DIGITAL DIAGNOSTIC UNILATERAL RIGHT MAMMOGRAM WITH TOMOSYNTHESIS; ULTRASOUND RIGHT BREAST LIMITED   TECHNIQUE: Right digital diagnostic mammography and breast tomosynthesis was performed.; Targeted ultrasound examination of the right breast was performed   COMPARISON:  Previous exam(s).   ACR Breast Density Category b: There are scattered areas of fibroglandular density.   FINDINGS: Additional mammographic views of the right breast demonstrate persistent focal asymmetry with associated indeterminate microcalcifications measuring 1.4 x 1.4 x 2.3 cm., located in the upper inner quadrant of the right breast, far posterior depth.   Targeted right breast ultrasound is performed and demonstrates 2 o'clock 9 cm from the nipple vaguely defined hypoechoic lobulated mass containing microcalcifications measuring 1.4 x 0.4 x 1.0 cm. There is an apparent ductal extension. This finding corresponds to the mammographically seen focal  asymmetry/calcifications. There is no evidence of right axillary lymphadenopathy.   IMPRESSION: Right breast 2 o'clock indeterminate mass containing microcalcifications, for which ultrasound-guided core needle biopsy is recommended.   RECOMMENDATION: Ultrasound-guided core needle biopsy of the right breast.   I have discussed the findings and recommendations with the patient. If applicable, a reminder letter will be sent to the patient regarding the next appointment.   BI-RADS CATEGORY  4: Suspicious.     Electronically Signed   By: Ted Mcalpine M.D.   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  We reviewed the data noted below, and reviewed options including observation.  She would like to proceed with excisional biopsy, and she and  her husband had good questions, I believe were reasonably answered.  Without any guarantees ever expressed or implied.  Risks of surgery reviewed.  Radial scars -- Complex Sclerosing Lesion (CSL) Radial scars, also called complex sclerosing lesions, are a pathologic diagnosis, usually discovered incidentally when a breast mass or radiologic abnormality is removed or biopsied. Occasionally, radial scars are large enough to be detected on mammography as suspicious spiculated masses, which cannot be reliably differentiated from spiculated carcinoma by imaging alone. Radial scars are characterized microscopically by a fibroelastic core with radiating ducts and lobules. In general, surgical excision is recommended when radial scars or complex sclerosing lesions are diagnosed on core biopsy, based on series showing that 8 to 17 percent of surgical specimens at subsequent excision are positive for malignancy. These high upgrade rates  have led to the suggestion that these lesions may actually be premalignant lesions, progressing from scar to hyperplasia to carcinoma. This, however, is controversial, and more recent series suggest the presence of undiagnosed cancer is much lower, with upgrade rates in the range of 0.6 to 3.6 percent. The upgrade rate is even lower with large-volume (eg, vacuum-assisted) needle biopsy.   In a meta-analysis of over 3000 patients with radial scars, the upgrade rate after vacuum-assisted biopsy to invasive and in situ cancer was only 0 and 1 percent, respectively. In a study of 50 patients with radial scar who were followed by active surveillance, no patient progressed on interval imaging at 16 months. The current recommendations from the American Society of Breast Surgeons state that most radial scars "should be excised, although imaging follow-up is reasonable for small, image-detected radial scars that are completely removed or well-sampled with large-gauge devices and in the setting of imaging-pathology concordance".  No additional treatment beyond excision is needed for radial scars. The risk of subsequent breast cancer after excision in this population is small, and chemoprevention is not indicated.  Face-to-face time spent with the patient and accompanying care providers(if present) was 30 minutes, with more than 50% of the time spent counseling, educating, and coordinating care of the patient.    These notes generated with voice recognition software. I apologize for typographical errors.  Campbell Lerner M.D., FACS 03/09/2023, 3:26 PM

## 2023-03-09 ENCOUNTER — Ambulatory Visit (INDEPENDENT_AMBULATORY_CARE_PROVIDER_SITE_OTHER): Admitting: Surgery

## 2023-03-09 ENCOUNTER — Encounter: Payer: Self-pay | Admitting: Surgery

## 2023-03-09 VITALS — BP 104/74 | HR 76 | Temp 98.0°F | Ht 64.0 in | Wt 166.0 lb

## 2023-03-09 DIAGNOSIS — N6489 Other specified disorders of breast: Secondary | ICD-10-CM | POA: Insufficient documentation

## 2023-03-09 NOTE — Patient Instructions (Signed)
We have spoken today about removing a mass in your breast. This will be done by Dr. Claudine Mouton at [facility].  You will most likely be able to leave the hospital several hours after your surgery. Rarely, a patient needs to stay over night but this is a possibility.  Plan to tenatively be off work for 1 week following the surgery and may return with approximately 2 more weeks of a lifting restriction, no greater than 20 lbs.  Please see your Blue surgery sheet for more information. Our surgery scheduler will call you to look at surgery dates and to go over information.   What is radio frequency localization of the breast?(RFID) RFID tag localization uses radiofrequency technology to accurately pinpoint the tumor. Seeing exactly where the tumor is before surgery helps surgeons more effectively remove the entire tumor and spare surrounding healthy breast tissue.   Surgical Breast Biopsy A breast biopsy is a test where a mass is taken from your breast. The breast tissue is tested in a lab for cancer cells. A surgical breast biopsy is one type of breast biopsy. There are two ways this type can be done: Excisional biopsy. This takes out all of the changed breast tissue. Tell your doctor about: Any allergies you have. All medicines you are taking, including vitamins, herbs, eye drops, creams, and over-the-counter medicines. Any problems you or family members have had with anesthetic medicines. Any bleeding problems you have. Any medical conditions you have. Any surgeries you have had. Whether you are pregnant or may be pregnant. Any medical implants you have, if your surgery will be done using an MRI. What are the risks? This is a safe test. But problems may occur, including: Infection. Bleeding. Allergic reactions to medicines. Damage to other tissues. What happens before the surgery? Medicines Ask your doctor about changing or stopping: Your normal medicines. Vitamins, herbs, and  supplements. Over-the-counter medicines. Do not take aspirin or ibuprofen unless you are told to. Lifestyle Do not smoke or use any products that contain nicotine or tobacco before the procedure. If you need help quitting, ask your doctor. Do not drink alcohol for 24 hours before your procedure. General instructions You may have a procedure to find and mark the lump, mass, or other change in your breast. If the changed tissue cannot be felt, you may have a mammogram, sound waves (ultrasound), or MRI to find it. A small clip may be placed in the changed breast tissue. This helps the surgeon find the tissue that needs to be tested. Do not put on antiperspirants or deodorants the day of the procedure. Wear a good support bra to the test. You will be asked to remove jewelry, dentures, or other things. For your safety, your doctor may: Loraine Leriche the area of surgery. Wash your breast with a soap that kills germs. Give you antibiotic medicine. If you will be going home right after the procedure, plan to have a responsible adult: Take you home from the hospital or clinic. You will not be allowed to drive. Care for you for the time you are told. What happens during the surgery?  An IV tube will be put into a vein in your hand or arm. You may be given: A sedative. This medicine helps you relax. Anesthetics. These medicines: Numb certain areas of your body. Make you fall asleep for surgery. A cut (incision) will be made in your breast. The surgeon will find the area of the breast where tissue will be taken out. The surgeon  will take out part or all of the lump or mass. Some normal tissue around the lump or mass may also be taken out. This will be sent for pathology. The surgeon will take out the clip placed in your breast, if you had one placed. The incision will be stitched or taped and covered with a bandage. Your doctor may: Wrap a tight bandage around your chest. Put an ice pack on your  breast. The procedure may vary among doctors and hospitals. What happens after the surgery? You will be taken to a room to recover. You will be able to go home when you are doing well and have no problems. You may need to wear a support bra for some time. You may be given pain medicine. If you were given a sedative during your procedure, do not drive or use machines until your doctor says that it is safe. It is up to you to get your test results. Ask how you will get your results and when they will be ready. Summary A surgical breast biopsy is one type of breast biopsy. This is a safe procedure, but problems can occur. Risks include infection, bleeding, allergic reaction, or tissue damage. Ask your doctor about changing or stopping your normal medicines. Plan to have a responsible adult take you home from the hospital or clinic. This information is not intended to replace advice given to you by your health care provider. Make sure you discuss any questions you have with your health care provider. Document Revised: 06/09/2021 Document Reviewed: 06/09/2021 Elsevier Patient Education  2024 [employer].

## 2023-03-10 ENCOUNTER — Other Ambulatory Visit: Payer: Self-pay | Admitting: Surgery

## 2023-03-10 DIAGNOSIS — N6489 Other specified disorders of breast: Secondary | ICD-10-CM

## 2023-03-10 DIAGNOSIS — R928 Other abnormal and inconclusive findings on diagnostic imaging of breast: Secondary | ICD-10-CM

## 2023-03-13 ENCOUNTER — Telehealth: Payer: Self-pay | Admitting: Surgery

## 2023-03-13 NOTE — Telephone Encounter (Signed)
Patient has been advised of Pre-Admission date/time, and Surgery date at [facility].  Surgery Date: 03/24/23 Preadmission Testing Date: 03/20/23 (phone 8a-1p)  Patient has been made aware to call [phone], between 1-3:00pm the day before surgery, to find out what time to arrive for surgery.    Also patient reminded of her tag placement at [facility] for 03/14/23.

## 2023-03-14 ENCOUNTER — Other Ambulatory Visit

## 2023-03-14 ENCOUNTER — Ambulatory Visit
Admission: RE | Admit: 2023-03-14 | Discharge: 2023-03-14 | Disposition: A | Discharge status: Home / Self Care | Source: Ambulatory Visit | Attending: Surgery | Admitting: Surgery

## 2023-03-14 DIAGNOSIS — N6489 Other specified disorders of breast: Secondary | ICD-10-CM | POA: Insufficient documentation

## 2023-03-14 DIAGNOSIS — R928 Other abnormal and inconclusive findings on diagnostic imaging of breast: Secondary | ICD-10-CM

## 2023-03-14 MED ORDER — LIDOCAINE HCL 1 % IJ SOLN
5.0000 mL | Freq: Once | INTRAMUSCULAR | Status: AC
  Administered 2023-03-14: 5 mL

## 2023-03-20 ENCOUNTER — Encounter
Admission: RE | Admit: 2023-03-20 | Discharge: 2023-03-20 | Disposition: A | Discharge status: Home / Self Care | Source: Ambulatory Visit | Attending: Surgery | Admitting: Surgery

## 2023-03-20 ENCOUNTER — Ambulatory Visit: Payer: Self-pay | Admitting: Surgery

## 2023-03-20 DIAGNOSIS — N6489 Other specified disorders of breast: Secondary | ICD-10-CM

## 2023-03-20 DIAGNOSIS — Z01818 Encounter for other preprocedural examination: Secondary | ICD-10-CM

## 2023-03-20 HISTORY — DX: Anemia, unspecified: D64.9

## 2023-03-20 HISTORY — DX: Other specified disorders of breast: N64.89

## 2023-03-20 NOTE — Patient Instructions (Signed)
Your procedure is scheduled on:03-24-23 Friday Report to the Registration Desk on the 1st floor of the [facility].Then proceed to the 2nd floor Surgery Desk To find out your arrival time, please call [phone] between 1PM - 3PM on:03-23-23 Thursday If your arrival time is 6:00 am, do not arrive before that time as the [facility] entrance doors do not open until 6:00 am.  REMEMBER: Instructions that are not followed completely may result in serious medical risk, up to and including death; or upon the discretion of your surgeon and anesthesiologist your surgery may need to be rescheduled.  Do not eat food OR drink any liquids after midnight the night before surgery.  No gum chewing or hard candies.  One week prior to surgery: Stop Anti-inflammatories (NSAIDS) such as Advil, Aleve, Ibuprofen, Motrin, Naproxen, Naprosyn and Aspirin based products such as Excedrin, Goody's Powder, BC Powder.You may however, take Tylenol if needed for pain up until the day of surgery. Stop ANY OVER THE COUNTER supplements/vitamins NOW (03-20-23) until after surgery (Vitamin D, Vitamin B12 and multivitamin)  Continue taking all prescribed medications   Do NOT take any medication the day of surgery  No Alcohol for 24 hours before or after surgery.  No Smoking including e-cigarettes for 24 hours before surgery.  No chewable tobacco products for at least 6 hours before surgery.  No nicotine patches on the day of surgery.  Do not use any "recreational" drugs for at least a week (preferably 2 weeks) before your surgery.  Please be advised that the combination of cocaine and anesthesia may have negative outcomes, up to and including death. If you test positive for cocaine, your surgery will be cancelled.  On the morning of surgery brush your teeth with toothpaste and water, you may rinse your mouth with mouthwash if you wish. Do not swallow any toothpaste or mouthwash.  Use CHG Soap as directed on instruction  sheet.  Do not wear jewelry, make-up, hairpins, clips or nail polish.  Do not wear lotions, powders, or perfumes.   Do not shave body hair from the neck down 48 hours before surgery.  Contact lenses, hearing aids and dentures may not be worn into surgery.  Do not bring valuables to the hospital. [facility] is not responsible for any missing/lost belongings or valuables.   Notify your doctor if there is any change in your medical condition (cold, fever, infection).  Wear comfortable clothing (specific to your surgery type) to the hospital.  After surgery, you can help prevent lung complications by doing breathing exercises.  Take deep breaths and cough every 1-2 hours. Your doctor may order a device called an Incentive Spirometer to help you take deep breaths. When coughing or sneezing, hold a pillow firmly against your incision with both hands. This is called "splinting." Doing this helps protect your incision. It also decreases belly discomfort.  If you are being admitted to the hospital overnight, leave your suitcase in the car. After surgery it may be brought to your room.  In case of increased patient census, it may be necessary for you, the patient, to continue your postoperative care in the Same Day Surgery department.  If you are being discharged the day of surgery, you will not be allowed to drive home. You will need a responsible individual to drive you home and stay with you for 24 hours after surgery.   If you are taking public transportation, you will need to have a responsible individual with you.  Please  call the Pre-admissions Testing Dept. at [phone] if you have any questions about these instructions.  Surgery Visitation Policy:  Patients having surgery or a procedure may have two visitors.  Children under the age of 12 must have an adult with them who is not the patient.     Preparing for Surgery with CHLORHEXIDINE GLUCONATE (CHG) Soap  Chlorhexidine  Gluconate (CHG) Soap  o An antiseptic cleaner that kills germs and bonds with the skin to continue killing germs even after washing  o Used for showering the night before surgery and morning of surgery  Before surgery, you can play an important role by reducing the number of germs on your skin.  CHG (Chlorhexidine gluconate) soap is an antiseptic cleanser which kills germs and bonds with the skin to continue killing germs even after washing.  Please do not use if you have an allergy to CHG or antibacterial soaps. If your skin becomes reddened/irritated stop using the CHG.  1. Shower the NIGHT BEFORE SURGERY and the MORNING OF SURGERY with CHG soap.  2. If you choose to wash your hair, wash your hair first as usual with your normal shampoo.  3. After shampooing, rinse your hair and body thoroughly to remove the shampoo.  4. Use CHG as you would any other liquid soap. You can apply CHG directly to the skin and wash gently with a scrungie or a clean washcloth.  5. Apply the CHG soap to your body only from the neck down. Do not use on open wounds or open sores. Avoid contact with your eyes, ears, mouth, and genitals (private parts). Wash face and genitals (private parts) with your normal soap.  6. Wash thoroughly, paying special attention to the area where your surgery will be performed.  7. Thoroughly rinse your body with warm water.  8. Do not shower/wash with your normal soap after using and rinsing off the CHG soap.  9. Pat yourself dry with a clean towel.  10. Wear clean pajamas to bed the night before surgery.  12. Place clean sheets on your bed the night of your first shower and do not sleep with pets.  13. Shower again with the CHG soap on the day of surgery prior to arriving at the hospital.  14. Do not apply any deodorants/lotions/powders.  15. Please wear clean clothes to the hospital.

## 2023-03-24 ENCOUNTER — Ambulatory Visit: Admitting: Anesthesiology

## 2023-03-24 ENCOUNTER — Ambulatory Visit
Admission: RE | Admit: 2023-03-24 | Discharge: 2023-03-24 | Disposition: A | Discharge status: Home / Self Care | Attending: Surgery | Admitting: Surgery

## 2023-03-24 ENCOUNTER — Other Ambulatory Visit: Payer: Self-pay

## 2023-03-24 ENCOUNTER — Encounter: Payer: Self-pay | Admitting: Surgery

## 2023-03-24 ENCOUNTER — Ambulatory Visit
Admission: RE | Admit: 2023-03-24 | Discharge: 2023-03-24 | Disposition: A | Discharge status: Home / Self Care | Source: Ambulatory Visit | Attending: Surgery | Admitting: Surgery

## 2023-03-24 ENCOUNTER — Ambulatory Visit: Admitting: Urgent Care

## 2023-03-24 ENCOUNTER — Encounter
Admission: RE | Disposition: A | Discharge status: Home / Self Care | Payer: Self-pay | Source: Home / Self Care | Attending: Surgery

## 2023-03-24 DIAGNOSIS — N6081 Other benign mammary dysplasias of right breast: Secondary | ICD-10-CM | POA: Diagnosis not present

## 2023-03-24 DIAGNOSIS — Z01818 Encounter for other preprocedural examination: Secondary | ICD-10-CM

## 2023-03-24 DIAGNOSIS — N62 Hypertrophy of breast: Secondary | ICD-10-CM | POA: Diagnosis not present

## 2023-03-24 DIAGNOSIS — N6489 Other specified disorders of breast: Secondary | ICD-10-CM

## 2023-03-24 DIAGNOSIS — R928 Other abnormal and inconclusive findings on diagnostic imaging of breast: Secondary | ICD-10-CM

## 2023-03-24 DIAGNOSIS — N6021 Fibroadenosis of right breast: Secondary | ICD-10-CM | POA: Insufficient documentation

## 2023-03-24 HISTORY — PX: BREAST BIOPSY WITH RADIO FREQUENCY LOCALIZER: SHX6895

## 2023-03-24 LAB — CBC WITH DIFFERENTIAL/PLATELET
Abs Immature Granulocytes: 0.02 10*3/uL (ref 0.00–0.07)
Basophils Absolute: 0 10*3/uL (ref 0.0–0.1)
Basophils Relative: 1 %
Eosinophils Absolute: 0 10*3/uL (ref 0.0–0.5)
Eosinophils Relative: 1 %
HCT: 38.3 % (ref 36.0–46.0)
Hemoglobin: 12.7 g/dL (ref 12.0–15.0)
Immature Granulocytes: 0 %
Lymphocytes Relative: 31 %
Lymphs Abs: 1.6 10*3/uL (ref 0.7–4.0)
MCH: 30 pg (ref 26.0–34.0)
MCHC: 33.2 g/dL (ref 30.0–36.0)
MCV: 90.5 fL (ref 80.0–100.0)
Monocytes Absolute: 0.3 10*3/uL (ref 0.1–1.0)
Monocytes Relative: 5 %
Neutro Abs: 3.2 10*3/uL (ref 1.7–7.7)
Neutrophils Relative %: 62 %
Platelets: 265 10*3/uL (ref 150–400)
RBC: 4.23 MIL/uL (ref 3.87–5.11)
RDW: 13.3 % (ref 11.5–15.5)
WBC: 5.1 10*3/uL (ref 4.0–10.5)
nRBC: 0 % (ref 0.0–0.2)

## 2023-03-24 LAB — COMPREHENSIVE METABOLIC PANEL
ALT: 17 U/L (ref 0–44)
AST: 17 U/L (ref 15–41)
Albumin: 4 g/dL (ref 3.5–5.0)
Alkaline Phosphatase: 37 U/L — ABNORMAL LOW (ref 38–126)
Anion gap: 5 (ref 5–15)
BUN: 8 mg/dL (ref 6–20)
CO2: 25 mmol/L (ref 22–32)
Calcium: 8.8 mg/dL — ABNORMAL LOW (ref 8.9–10.3)
Chloride: 108 mmol/L (ref 98–111)
Creatinine, Ser: 0.69 mg/dL (ref 0.44–1.00)
GFR, Estimated: >60 mL/min (ref >60)
Glucose, Bld: 97 mg/dL (ref 70–99)
Potassium: 3.9 mmol/L (ref 3.5–5.1)
Sodium: 138 mmol/L (ref 135–145)
Total Bilirubin: 0.6 mg/dL (ref 0.3–1.2)
Total Protein: 6.4 g/dL — ABNORMAL LOW (ref 6.5–8.1)

## 2023-03-24 LAB — POCT PREGNANCY, URINE: Preg Test, Ur: NEGATIVE

## 2023-03-24 SURGERY — BREAST BIOPSY WITH RADIO FREQUENCY LOCALIZER
Anesthesia: General | Site: Breast | Laterality: Right

## 2023-03-24 MED ORDER — BUPIVACAINE-EPINEPHRINE (PF) 0.25% -1:200000 IJ SOLN
INTRAMUSCULAR | Status: DC | PRN
  Administered 2023-03-24: 10 mL

## 2023-03-24 MED ORDER — LIDOCAINE HCL (CARDIAC) PF 100 MG/5ML IV SOSY
PREFILLED_SYRINGE | INTRAVENOUS | Status: DC | PRN
  Administered 2023-03-24: 80 mg via INTRAVENOUS

## 2023-03-24 MED ORDER — GABAPENTIN 300 MG PO CAPS
300.0000 mg | ORAL_CAPSULE | ORAL | Status: AC
  Administered 2023-03-24: 300 mg via ORAL

## 2023-03-24 MED ORDER — BUPIVACAINE LIPOSOME 1.3 % IJ SUSP: 20.0000 mL | Freq: Once | INTRAMUSCULAR | Status: DC

## 2023-03-24 MED ORDER — EPHEDRINE SULFATE (PRESSORS) 50 MG/ML IJ SOLN
INTRAMUSCULAR | Status: DC | PRN
  Administered 2023-03-24: 5 mg via INTRAVENOUS
  Administered 2023-03-24 (×2): 10 mg via INTRAVENOUS

## 2023-03-24 MED ORDER — ONDANSETRON HCL 4 MG/2ML IJ SOLN
INTRAMUSCULAR | Status: DC | PRN
  Administered 2023-03-24: 4 mg via INTRAVENOUS

## 2023-03-24 MED ORDER — CHLORHEXIDINE GLUCONATE 0.12 % MT SOLN
15.0000 mL | Freq: Once | OROMUCOSAL | Status: AC
  Administered 2023-03-24: 15 mL via OROMUCOSAL

## 2023-03-24 MED ORDER — CHLORHEXIDINE GLUCONATE CLOTH 2 % EX PADS
6.0000 | MEDICATED_PAD | Freq: Once | CUTANEOUS | Status: AC
  Administered 2023-03-24: 6 via TOPICAL

## 2023-03-24 MED ORDER — CELECOXIB 200 MG PO CAPS
200.0000 mg | ORAL_CAPSULE | ORAL | Status: AC
  Administered 2023-03-24: 200 mg via ORAL

## 2023-03-24 MED ORDER — ONDANSETRON HCL 4 MG/2ML IJ SOLN: 4.0000 mg | Freq: Once | INTRAMUSCULAR | Status: DC | PRN

## 2023-03-24 MED ORDER — LACTATED RINGERS IV SOLN: INTRAVENOUS | Status: DC

## 2023-03-24 MED ORDER — CEFAZOLIN SODIUM-DEXTROSE 2-4 GM/100ML-% IV SOLN
INTRAVENOUS | Status: AC
  Filled 2023-03-24: qty 100

## 2023-03-24 MED ORDER — FAMOTIDINE 20 MG PO TABS
20.0000 mg | ORAL_TABLET | Freq: Once | ORAL | Status: AC
  Administered 2023-03-24: 20 mg via ORAL

## 2023-03-24 MED ORDER — FAMOTIDINE 20 MG PO TABS
ORAL_TABLET | ORAL | Status: AC
  Filled 2023-03-24: qty 1

## 2023-03-24 MED ORDER — IBUPROFEN 800 MG PO TABS: 800.0000 mg | ORAL_TABLET | Freq: Three times a day (TID) | ORAL | 0 refills | Status: DC | PRN

## 2023-03-24 MED ORDER — PROPOFOL 10 MG/ML IV BOLUS
INTRAVENOUS | Status: DC | PRN
  Administered 2023-03-24: 150 mg via INTRAVENOUS

## 2023-03-24 MED ORDER — STERILE WATER FOR IRRIGATION IR SOLN
Status: DC | PRN
  Administered 2023-03-24: 500 mL

## 2023-03-24 MED ORDER — GABAPENTIN 300 MG PO CAPS
ORAL_CAPSULE | ORAL | Status: AC
  Filled 2023-03-24: qty 1

## 2023-03-24 MED ORDER — FENTANYL CITRATE (PF) 100 MCG/2ML IJ SOLN
INTRAMUSCULAR | Status: AC
  Filled 2023-03-24: qty 2

## 2023-03-24 MED ORDER — PHENYLEPHRINE 80 MCG/ML (10ML) SYRINGE FOR IV PUSH (FOR BLOOD PRESSURE SUPPORT)
PREFILLED_SYRINGE | INTRAVENOUS | Status: DC | PRN
  Administered 2023-03-24: 40 ug via INTRAVENOUS

## 2023-03-24 MED ORDER — ACETAMINOPHEN 500 MG PO TABS
ORAL_TABLET | ORAL | Status: AC
  Filled 2023-03-24: qty 2

## 2023-03-24 MED ORDER — CELECOXIB 200 MG PO CAPS
ORAL_CAPSULE | ORAL | Status: AC
  Filled 2023-03-24: qty 1

## 2023-03-24 MED ORDER — ACETAMINOPHEN 500 MG PO TABS
1000.0000 mg | ORAL_TABLET | ORAL | Status: AC
  Administered 2023-03-24: 1000 mg via ORAL

## 2023-03-24 MED ORDER — BUPIVACAINE LIPOSOME 1.3 % IJ SUSP
INTRAMUSCULAR | Status: AC
  Filled 2023-03-24: qty 20

## 2023-03-24 MED ORDER — ORAL CARE MOUTH RINSE: 15.0000 mL | Freq: Once | OROMUCOSAL | Status: AC

## 2023-03-24 MED ORDER — OXYCODONE HCL 5 MG PO TABS: 5.0000 mg | ORAL_TABLET | Freq: Once | ORAL | Status: DC | PRN

## 2023-03-24 MED ORDER — MIDAZOLAM HCL 2 MG/2ML IJ SOLN
INTRAMUSCULAR | Status: AC
  Filled 2023-03-24: qty 2

## 2023-03-24 MED ORDER — OXYCODONE HCL 5 MG/5ML PO SOLN: 5.0000 mg | Freq: Once | ORAL | Status: DC | PRN

## 2023-03-24 MED ORDER — ACETAMINOPHEN 10 MG/ML IV SOLN: 1000.0000 mg | Freq: Once | INTRAVENOUS | Status: DC | PRN

## 2023-03-24 MED ORDER — ACETAMINOPHEN 10 MG/ML IV SOLN
INTRAVENOUS | Status: AC
  Filled 2023-03-24: qty 100

## 2023-03-24 MED ORDER — BUPIVACAINE-EPINEPHRINE (PF) 0.25% -1:200000 IJ SOLN
INTRAMUSCULAR | Status: AC
  Filled 2023-03-24: qty 30

## 2023-03-24 MED ORDER — KETAMINE HCL 50 MG/5ML IJ SOSY
PREFILLED_SYRINGE | INTRAMUSCULAR | Status: AC
  Filled 2023-03-24: qty 5

## 2023-03-24 MED ORDER — HYDROCODONE-ACETAMINOPHEN 5-325 MG PO TABS: 1.0000 | ORAL_TABLET | Freq: Four times a day (QID) | ORAL | 0 refills | Status: DC | PRN

## 2023-03-24 MED ORDER — FENTANYL CITRATE (PF) 100 MCG/2ML IJ SOLN: 25.0000 ug | INTRAMUSCULAR | Status: DC | PRN

## 2023-03-24 MED ORDER — MIDAZOLAM HCL 2 MG/2ML IJ SOLN
INTRAMUSCULAR | Status: DC | PRN
  Administered 2023-03-24: 2 mg via INTRAVENOUS

## 2023-03-24 MED ORDER — CEFAZOLIN SODIUM-DEXTROSE 2-4 GM/100ML-% IV SOLN
2.0000 g | INTRAVENOUS | Status: AC
  Administered 2023-03-24: 2 g via INTRAVENOUS

## 2023-03-24 MED ORDER — DEXAMETHASONE SODIUM PHOSPHATE 10 MG/ML IJ SOLN
INTRAMUSCULAR | Status: DC | PRN
  Administered 2023-03-24: 10 mg via INTRAVENOUS

## 2023-03-24 MED ORDER — PROPOFOL 10 MG/ML IV BOLUS
INTRAVENOUS | Status: AC
  Filled 2023-03-24: qty 20

## 2023-03-24 MED ORDER — CHLORHEXIDINE GLUCONATE 0.12 % MT SOLN
OROMUCOSAL | Status: AC
  Filled 2023-03-24: qty 15

## 2023-03-24 SURGICAL SUPPLY — 42 items
ADH SKN CLS APL DERMABOND .7 (GAUZE/BANDAGES/DRESSINGS) ×1
APL PRP STRL LF DISP 70% ISPRP (MISCELLANEOUS) ×1
APPLIER CLIP 9.375 SM OPEN (CLIP)
APR CLP SM 9.3 20 MLT OPN (CLIP)
BLADE SURG 15 STRL LF DISP TIS (BLADE) ×1 IMPLANT
BLADE SURG 15 STRL SS (BLADE) ×1
CHLORAPREP W/TINT 26 (MISCELLANEOUS) ×1 IMPLANT
CLIP APPLIE 9.375 SM OPEN (CLIP) IMPLANT
CNTNR URN SCR LID CUP LEK RST (MISCELLANEOUS) IMPLANT
CONT SPEC 4OZ STRL OR WHT (MISCELLANEOUS)
DERMABOND ADVANCED .7 DNX12 (GAUZE/BANDAGES/DRESSINGS) ×1 IMPLANT
DEVICE DUBIN SPECIMEN MAMMOGRA (MISCELLANEOUS) ×1 IMPLANT
DRAPE LAPAROTOMY TRNSV 106X77 (MISCELLANEOUS) ×1 IMPLANT
ELECT CAUTERY BLADE TIP 2.5 (TIP) ×1
ELECT REM PT RETURN 9FT ADLT (ELECTROSURGICAL) ×1
ELECTRODE CAUTERY BLDE TIP 2.5 (TIP) ×1 IMPLANT
ELECTRODE REM PT RTRN 9FT ADLT (ELECTROSURGICAL) ×1 IMPLANT
GAUZE 4X4 16PLY ~~LOC~~+RFID DBL (SPONGE) ×1 IMPLANT
GLOVE ORTHO TXT STRL SZ7.5 (GLOVE) ×1 IMPLANT
GOWN STRL REUS W/ TWL LRG LVL3 (GOWN DISPOSABLE) ×1 IMPLANT
GOWN STRL REUS W/ TWL XL LVL3 (GOWN DISPOSABLE) ×1 IMPLANT
GOWN STRL REUS W/TWL LRG LVL3 (GOWN DISPOSABLE) ×1
GOWN STRL REUS W/TWL XL LVL3 (GOWN DISPOSABLE) ×1
KIT MARKER MARGIN INK (KITS) IMPLANT
KIT TURNOVER KIT A (KITS) ×1 IMPLANT
MANIFOLD NEPTUNE II (INSTRUMENTS) ×1 IMPLANT
NDL HYPO 22X1.5 SAFETY MO (MISCELLANEOUS) ×1 IMPLANT
NEEDLE HYPO 22X1.5 SAFETY MO (MISCELLANEOUS) ×1 IMPLANT
PACK BASIN MINOR ARMC (MISCELLANEOUS) ×1 IMPLANT
SHEATH BREAST BIOPSY SKIN MKR (SHEATH) ×2 IMPLANT
SUT MNCRL 4-0 (SUTURE) ×1
SUT MNCRL 4-0 27XMFL (SUTURE) ×1
SUT SILK 2 0 SH (SUTURE) IMPLANT
SUT SILK 3 0 SH 30 (SUTURE) IMPLANT
SUT VIC AB 3-0 SH 27 (SUTURE) ×1
SUT VIC AB 3-0 SH 27X BRD (SUTURE) ×1 IMPLANT
SUTURE MNCRL 4-0 27XMF (SUTURE) ×1 IMPLANT
SYR 20ML LL LF (SYRINGE) ×1 IMPLANT
TRAP FLUID SMOKE EVACUATOR (MISCELLANEOUS) ×1 IMPLANT
TRAP NEPTUNE SPECIMEN COLLECT (MISCELLANEOUS) ×1 IMPLANT
WATER STERILE IRR 1000ML POUR (IV SOLUTION) ×1 IMPLANT
WATER STERILE IRR 500ML POUR (IV SOLUTION) ×1 IMPLANT

## 2023-03-24 NOTE — Anesthesia Postprocedure Evaluation (Signed)
Anesthesia Post Note  Patient: Penny Tapia  Procedure(s) Performed: BREAST BIOPSY WITH RADIO FREQUENCY LOCALIZER (Right: Breast)  Patient location during evaluation: PACU Anesthesia Type: General Level of consciousness: awake and alert, oriented and patient cooperative Pain management: pain level controlled Vital Signs Assessment: post-procedure vital signs reviewed and stable Respiratory status: spontaneous breathing, nonlabored ventilation and respiratory function stable Cardiovascular status: blood pressure returned to baseline and stable Postop Assessment: adequate PO intake Anesthetic complications: no   No notable events documented.   Last Vitals:  Vitals:   03/24/23 1030 03/24/23 1100  BP: 114/73 (!) 112/55  Pulse: 74 67  Resp: 15 13  Temp:  (!) 36.2 C  SpO2: 99% 99%    Last Pain:  Vitals:   03/24/23 1045  TempSrc:   PainSc: 0-No pain                 Reed Breech

## 2023-03-24 NOTE — Interval H&P Note (Signed)
History and Physical Interval Note:  03/24/2023 8:46 AM  Penny Tapia  has presented today for surgery, with the diagnosis of right breast complex sclerosing lesion.  The various methods of treatment have been discussed with the patient and family. After consideration of risks, benefits and other options for treatment, the patient has consented to  Procedure(s): BREAST BIOPSY WITH RADIO FREQUENCY LOCALIZER (Right) as a surgical intervention.  The patient's history has been reviewed, patient examined, no change in status, stable for surgery.  I have reviewed the patient's chart and labs.  Questions were answered to the patient's satisfaction.    The right side is marked.   Campbell Lerner

## 2023-03-24 NOTE — Transfer of Care (Signed)
Immediate Anesthesia Transfer of Care Note  Patient: Penny Tapia  Procedure(s) Performed: BREAST BIOPSY WITH RADIO FREQUENCY LOCALIZER (Right: Breast)  Patient Location: PACU  Anesthesia Type:General  Level of Consciousness: awake  Airway & Oxygen Therapy: Patient Spontanous Breathing  Post-op Assessment: Report given to RN and Post -op Vital signs reviewed and stable  Post vital signs: Reviewed and stable  Last Vitals:  Vitals Value Taken Time  BP 130/57 03/24/23 1023  Temp    Pulse 85 03/24/23 1024  Resp 15 03/24/23 1024  SpO2 100 % 03/24/23 1024  Vitals shown include unfiled device data.  Last Pain:  Vitals:   03/24/23 1023  TempSrc:   PainSc: 0-No pain         Complications: No notable events documented.

## 2023-03-24 NOTE — Discharge Instructions (Signed)

## 2023-03-24 NOTE — Anesthesia Preprocedure Evaluation (Addendum)
Anesthesia Evaluation  Patient identified by MRN, date of birth, ID band Patient awake    Reviewed: Allergy & Precautions, NPO status , Patient's Chart, lab work & pertinent test results  History of Anesthesia Complications Negative for: history of anesthetic complications  Airway Mallampati: I   Neck ROM: Full    Dental no notable dental hx.    Pulmonary neg pulmonary ROS   Pulmonary exam normal breath sounds clear to auscultation       Cardiovascular Exercise Tolerance: Good negative cardio ROS Normal cardiovascular exam Rhythm:Regular Rate:Normal     Neuro/Psych Chronic pain    GI/Hepatic negative GI ROS,,,  Endo/Other  negative endocrine ROS    Renal/GU negative Renal ROS     Musculoskeletal   Abdominal   Peds  Hematology negative hematology ROS (+)   Anesthesia Other Findings   Reproductive/Obstetrics                             Anesthesia Physical Anesthesia Plan  ASA: 1  Anesthesia Plan: General   Post-op Pain Management:    Induction: Intravenous  PONV Risk Score and Plan: 3 and Ondansetron, Dexamethasone and Treatment may vary due to age or medical condition  Airway Management Planned: LMA  Additional Equipment:   Intra-op Plan:   Post-operative Plan: Extubation in OR  Informed Consent: I have reviewed the patients History and Physical, chart, labs and discussed the procedure including the risks, benefits and alternatives for the proposed anesthesia with the patient or authorized representative who has indicated his/her understanding and acceptance.     Dental advisory given  Plan Discussed with: CRNA  Anesthesia Plan Comments: (Patient consented for risks of anesthesia including but not limited to:  - adverse reactions to medications - damage to eyes, teeth, lips or other oral mucosa - nerve damage due to positioning  - sore throat or hoarseness -  damage to heart, brain, nerves, lungs, other parts of body or loss of life  Informed patient about role of CRNA in peri- and intra-operative care.  Patient voiced understanding.)       Anesthesia Quick Evaluation

## 2023-03-24 NOTE — Op Note (Signed)
  Pre-operative Diagnosis: Complex sclerosing lesion upper medial right breast    Post-operative Diagnosis: Same  Surgeon: Campbell Lerner, M.D., FACS  Anesthesia: General LMA  Procedure: Right excisional biopsy, Scout tag directed,   Procedure Details  The patient was seen again in the Holding Room. The benefits, complications, treatment options, and expected outcomes were discussed with the patient. The risks of bleeding, infection, recurrence of symptoms, failure to resolve symptoms, hematoma, seroma, open wound, cosmetic deformity, and the need for further surgery were discussed.  The patient was taken to Operating Room, identified as Penny Tapia and the procedure verified.  A Time Out was held and the above information confirmed.  Prior to the induction of general anesthesia, antibiotic prophylaxis was administered. VTE prophylaxis was in place. The patient was positioned in the supine position. Appropriate anesthesia was then administered and tolerated well. The Scout is used to mark the skin for incision.  The chest was prepped with Chloraprep and draped in the sterile fashion.    Attention was turned to the [facility] tag localization site where an incision was made. Dissection using the Scout probe to perform a lumpectomy with adequate margins was performed. This was done with sharp dissection with Metzenbaum scissors. There was minimal bleeding, and the cavity packed.  The specimen was taken to the back table and painted to demarcate the 6 surfaces of potential margin.  The distance was closest to the scalp tag from the cephalad and lateral aspects.   So again with sharp dissection I obtained an additional margin on the superior and lateral aspects.  I returned to the cavity to remove the packing, and hemostasis was confirmed with electrocautery.   Once assuring that hemostasis was adequate and checked multiple times and irrigated as well, the wound was closed with interrupted 3-0 Vicryl  followed by 4-0 subcuticular Monocryl sutures.  Dermabond is utilized to seal the incision.  A depot of local is instilled into the cavity.    Findings: Faxitron imaging: markers off center of specimen to the lateral superior margin, so additional margin from this perimeter is obtained, and submitted with the  Same color configuration as the initial specimen, yellow is lateral, red superior. (Noted this exception to the usual color oritentation)   Estimated Blood Loss: Minimal         Drains: None         Specimens: Initial specimen with tags present, additional margin from the superior and lateral aspects.       Complications: None         Condition: Stable   Campbell Lerner, M.D., [facility]  03/24/2023 ; 10:26 AM

## 2023-03-24 NOTE — Anesthesia Procedure Notes (Signed)
Procedure Name: LMA Insertion Date/Time: 03/24/2023 9:22 AM  Performed by: Cheral Bay, CRNAPre-anesthesia Checklist: Patient identified, Emergency Drugs available, Suction available and Patient being monitored Patient Re-evaluated:Patient Re-evaluated prior to induction Oxygen Delivery Method: Circle system utilized Preoxygenation: Pre-oxygenation with 100% oxygen Induction Type: IV induction Ventilation: Mask ventilation without difficulty LMA: LMA inserted LMA Size: 4.0 Tube type: Oral Number of attempts: 1 Placement Confirmation: positive ETCO2 and breath sounds checked- equal and bilateral Tube secured with: Tape Dental Injury: Teeth and Oropharynx as per pre-operative assessment

## 2023-03-25 ENCOUNTER — Encounter: Payer: Self-pay | Admitting: Surgery

## 2023-04-10 NOTE — Progress Notes (Unsigned)
[facility] POST-OP OFFICE VISIT  04/11/2023  HPI: Penny Tapia is a 47 y.o. female 3 weeks s/p tag localized upper medial right breast lumpectomy/excisional biopsy.  Noted some tightness of right arm range of motion at shoulder.  Had some shooting pains in the region of the biopsy.  This is diminished recently.  Diagnosis 1. 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 - EXTENSIVE ADENOSIS WITH CALCIFICATIONS AND ALH. - BIOPSY SITE CHANGE AND CLIP PRESENT. - RF IG TAG IN PLACE. 2. Breast, excision, additional superior lateral margin, right - USUAL DUCTAL HYPERPLASIA, APOCRINE METAPLASIA, COLUMNAR CELL CHANGE, AND SCLEROSING ADENOSIS. - NEGATIVE FOR ATYPIA AND MALIGNANCY.  Vital signs: BP 123/75   Pulse 71   Temp 98 F (36.7 C)   Ht 5\' 4"  (1.626 m)   Wt 165 lb (74.8 kg)   LMP 03/19/2023 (Exact Date)   SpO2 99%   BMI 28.32 kg/m    Physical Exam: Constitutional: She appears well.  Skin: Upper medial quadrant right breast incision, Dermabond is peeling away, incision appears to clean dry and intact.  No evidence of seroma or hematoma present.  Assessment/Plan: This is a 47 y.o. female 3 weeks s/p excision of complex sclerosing lesion of right breast, with biopsy revealing ALH.  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  Will anticipate adding breast MRI to screening protocol, and for now we will follow-up with diagnostic right mammogram in 6 months. We have made the information below available to the patient and her husband.  Risk-Reducing Medication Women with ADH, ALH, or LCIS should be counseled regarding their long-term increased risk of breast cancer  in both breasts and informed about medical treatment options to reduce their risk; absolute risk predictions are preferred to relative risk estimates. As described earlier, this risk is approximately 1% per year for women with AH and 1% to 2% per year for women with LCIS. Multiple clinical trials have demonstrated 30% to 50% risk reduction with antiestrogenic agents among women at increased risk, including tamoxifen, [id number] raloxifene, 104 exemestane, 105 and anastrozole. 106 Subset analyses of women with AH suggest even greater benefit (~70% relative risk reduction) among women with AH. [id number] The substantial risk-reduction benefit in the clinical trials is corroborated by observational data in large recent cohorts of high-risk women, with ~70% relative risk reduction in women with AH 68 or LCIS. 90 These data have led to consensus recommendations for prevention therapy in women with AH or LCIS, including the [place] Preventive Services Task Force, 366440347 although uptake and compliance with prevention therapy has been limited 112 because of side effects and toxicity concerns. One strategy to reduce side effects has been to reduce the oral dose of tamoxifen (from 20 mg to 5 mg daily), 113 and ongoing research is also investigating topical application of an active tamoxifen metabolite that should minimize systemic side effects. 114 In another strategy to improve prevention therapy, Brewster and colleagues implemented a performance improvement program that improved prevention therapy uptake by 40%. 115 Lastly, a recent implementation study showed that providing genomic information from a PRS increased prevention therapy uptake among women with increased breast cancer risk (37% of the study sample had AH). 116  Imaging Surveillance As women with AH or LCIS  are known to be at increased risk for breast cancer, close imaging surveillance should be considered. In addition to screening mammograms per recommended  guidelines, supplemental screening with breast MRI may be performed in keeping with American Cancer Society guidelines 117 and NCCN guidelines, 28 which support screening MRI for a lifetime risk of 20% or greater, calculated using a family history-based model (such as the IBIS model, which applies to women with either AH or LCIS).  Campbell Lerner M.D., FACS 04/11/2023, 1:53 PM

## 2023-04-11 ENCOUNTER — Encounter: Payer: Self-pay | Admitting: Surgery

## 2023-04-11 ENCOUNTER — Ambulatory Visit: Admitting: Surgery

## 2023-04-11 VITALS — BP 123/75 | HR 71 | Temp 98.0°F | Ht 64.0 in | Wt 165.0 lb

## 2023-04-11 DIAGNOSIS — Z09 Encounter for follow-up examination after completed treatment for conditions other than malignant neoplasm: Secondary | ICD-10-CM

## 2023-04-11 DIAGNOSIS — N6489 Other specified disorders of breast: Secondary | ICD-10-CM

## 2023-04-11 DIAGNOSIS — N6091 Unspecified benign mammary dysplasia of right breast: Secondary | ICD-10-CM | POA: Insufficient documentation

## 2023-04-11 NOTE — Patient Instructions (Addendum)
The patient has been asked to return to the office in six months with a unilateral right breast diagnostic mammogram. We will send you a letter about these appointments.   We will send a referral to Oncology for you to speak with them about some hormone blocking medications and if they are needed.   Risk-Reducing Medication Women with ADH, ALH, or LCIS should be counseled regarding their long-term increased risk of breast cancer in both breasts and informed about medical treatment options to reduce their risk; absolute risk predictions are preferred to relative risk estimates. As described earlier, this risk is approximately 1% per year for women with AH and 1% to 2% per year for women with LCIS. Multiple clinical trials have demonstrated 30% to 50% risk reduction with antiestrogenic agents among women at increased risk, including tamoxifen, [id number] raloxifene, 104 exemestane, 105 and anastrozole. 106 Subset analyses of women with AH suggest even greater benefit (~70% relative risk reduction) among women with AH. [id number] The substantial risk-reduction benefit in the clinical trials is corroborated by observational data in large recent cohorts of high-risk women, with ~70% relative risk reduction in women with AH 68 or LCIS. 90 These data have led to consensus recommendations for prevention therapy in women with AH or LCIS, including the [place] Preventive Services Task Force, 865784696 although uptake and compliance with prevention therapy has been limited 112 because of side effects and toxicity concerns. One strategy to reduce side effects has been to reduce the oral dose of tamoxifen (from 20 mg to 5 mg daily), 113 and ongoing research is also investigating topical application of an active tamoxifen metabolite that should minimize systemic side effects. 114 In another strategy to improve prevention therapy, Brewster and colleagues implemented a performance improvement program that improved prevention  therapy uptake by 40%. 115 Lastly, a recent implementation study showed that providing genomic information from a PRS increased prevention therapy uptake among women with increased breast cancer risk (37% of the study sample had AH). 116   Imaging Surveillance As women with AH or LCIS are known to be at increased risk for breast cancer, close imaging surveillance should be considered. In addition to screening mammograms per recommended guidelines, supplemental screening with breast MRI may be performed in keeping with American Cancer Society guidelines 117 and NCCN guidelines, 28 which support screening MRI for a lifetime risk of 20% or greater, calculated using a family history-based model (such as the IBIS model, which applies to women with either AH or LCIS).

## 2023-04-17 ENCOUNTER — Encounter: Payer: Self-pay | Admitting: Oncology

## 2023-04-17 ENCOUNTER — Inpatient Hospital Stay: Attending: Oncology | Admitting: Oncology

## 2023-04-17 ENCOUNTER — Inpatient Hospital Stay

## 2023-04-17 VITALS — BP 121/81 | HR 70 | Temp 97.2°F | Resp 18 | Ht 64.0 in | Wt 168.3 lb

## 2023-04-17 DIAGNOSIS — N6091 Unspecified benign mammary dysplasia of right breast: Secondary | ICD-10-CM | POA: Diagnosis present

## 2023-04-17 DIAGNOSIS — Z8049 Family history of malignant neoplasm of other genital organs: Secondary | ICD-10-CM

## 2023-04-17 DIAGNOSIS — Z800 Family history of malignant neoplasm of digestive organs: Secondary | ICD-10-CM

## 2023-04-17 DIAGNOSIS — Z7189 Other specified counseling: Secondary | ICD-10-CM

## 2023-04-17 DIAGNOSIS — Z808 Family history of malignant neoplasm of other organs or systems: Secondary | ICD-10-CM | POA: Diagnosis not present

## 2023-04-18 ENCOUNTER — Encounter: Payer: Self-pay | Admitting: Licensed Clinical Social Worker

## 2023-04-18 ENCOUNTER — Encounter: Payer: Self-pay | Admitting: Obstetrics and Gynecology

## 2023-04-18 ENCOUNTER — Telehealth: Payer: Self-pay | Admitting: Licensed Clinical Social Worker

## 2023-04-18 NOTE — Telephone Encounter (Signed)
Called patient per Dr. Assunta Gambles request and asked family hx questions. Patient does meet criteria for genetic testing and was interested in scheduling appointment with me. Patient scheduled 8/28 at 11 am.

## 2023-04-18 NOTE — Progress Notes (Signed)
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 Claudine Mouton  Date of visit: 04/18/23   Heme/Onc history: Patient is a 47 year old female who underwent screening mammogram in June 2024 which showed possible asymmetry in the right breast.  This was followed by diagnostic mammogram and ultrasound which showed 1.4 x 0.4 x 1 cm group of microcalcifications at the 2 o'clock position 9 cm from the nipple.  No evidence of right axillary adenopathy.  This was biopsy and was consistent with complex sclerosing lesion.  Patient was seen by Dr. Claudine Mouton and underwent definitive lumpectomy which showed complex sclerosing lesion 15 mm with usual ductal hyperplasia and atypical lobular hyperplasia.  Extensive adenosis with calcifications and ALH.  Additional superior lateral margin also showed usual ductal hyperplasia apocrine metaplasia and sclerosing adenosis.  Negative for atypia and malignancy.  Patient has been referred for further management  Patient is being followed by GYN and was being evaluated for abnormal uterine bleeding.  There was a talk about possible endometrial biopsy which was put on hold after her abnormal mammogram came back.   ECOG PS- 0  Pain scale- 0   Review of systems- Review of Systems  Constitutional:  Negative for chills, fever, malaise/fatigue and weight loss.  HENT:  Negative for congestion, ear discharge and nosebleeds.   Eyes:  Negative for blurred vision.  Respiratory:  Negative for cough, hemoptysis, sputum production, shortness of breath and wheezing.   Cardiovascular:  Negative for chest pain, palpitations, orthopnea and claudication.  Gastrointestinal:  Negative for  abdominal pain, blood in stool, constipation, diarrhea, heartburn, melena, nausea and vomiting.  Genitourinary:  Negative for dysuria, flank pain, frequency, hematuria and urgency.  Musculoskeletal:  Negative for back pain, joint pain and myalgias.  Skin:  Negative for rash.  Neurological:  Negative for dizziness, tingling, focal weakness, seizures, weakness and headaches.  Endo/Heme/Allergies:  Does not bruise/bleed easily.  Psychiatric/Behavioral:  Negative for depression and suicidal ideas. The patient does not have insomnia.     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 Moderate to severe atypia, peripheral margin involved. Excised 06/02/2020. Margins free.   Radial scar of breast      Past Surgical History:  Procedure Laterality Date   BREAST BIOPSY Right 03/06/2023   [place] Bx, Ribbon Clip, path pending   BREAST BIOPSY Right 03/06/2023   [place] RT BREAST BX W LOC DEV 1ST LESION IMG BX SPEC US GUIDE 03/06/2023 [facility]   BREAST BIOPSY WITH RADIO FREQUENCY LOCALIZER Right 03/24/2023   Procedure: BREAST BIOPSY WITH RADIO FREQUENCY LOCALIZER;  Surgeon: Campbell Lerner, MD;  Location: [facility] ORS;  Service: General;  Laterality: Right;   Cervical cryotherapy  04/13/1999   DILATION AND CURETTAGE OF UTERUS  09/2010   FRACTURE SURGERY  arm surgery    Social History   Socioeconomic History   Marital status: Married    Spouse name: Not on file   Number of children: Not on file   Years of education: Not on file   Highest education level: Not on file   Occupational History   Not on file  Tobacco Use   Smoking status: Never    Passive exposure: Never   Smokeless tobacco: Never  Vaping Use   Vaping status: Never Used  Substance and Sexual Activity   Alcohol use: Yes    Comment: occ   Drug use: Never   Sexual activity: Yes    Birth control/protection: None, Surgical    Comment: Vasectomy  Other Topics Concern   Not on file  Social History Narrative   Not on file   Social Determinants of Health   Financial Resource Strain: Low Risk  (12/26/2022)   Received from [facility], [facility]   Overall Financial Resource Strain (CARDIA)    Difficulty of Paying Living Expenses: Not hard at all  Food Insecurity: No Food Insecurity (04/17/2023)   Hunger Vital Sign    Worried About Running Out of Food in the Last Year: Never true    Ran Out of Food in the Last Year: Never true  Transportation Needs: No Transportation Needs (04/17/2023)   PRAPARE - Administrator, Civil Service (Medical): No    Lack of Transportation (Non-Medical): No  Physical Activity: Sufficiently Active (12/19/2019)   Received from [facility], [facility]   Exercise Vital Sign    Days of Exercise per Week: 6 days    Minutes of Exercise per Session: 40 min  Stress: No Stress Concern Present (12/19/2019)   Received from [facility], [facility]   [employer] of Occupational Health - Occupational Stress Questionnaire    Feeling of Stress : Only a little  Social Connections: Moderately Integrated (12/19/2019)   Received from [facility], [facility]   Social Connection and Isolation Panel [NHANES]    Frequency of Communication with Friends and Family: Three times a week    Frequency of Social Gatherings with Friends and Family: Never    Attends Religious Services: More than 4 times per year    Active Member of  [employer] or Organizations: No    Attends Banker Meetings: Never    Marital Status: Married  Catering manager Violence: Not At Risk (04/17/2023)   Humiliation, Afraid, Rape, and Kick questionnaire    Fear of Current or Ex-Partner: No    Emotionally Abused: No    Physically Abused: No    Sexually Abused: No     Family History  Problem Relation Age of Onset   Diabetes Maternal Grandmother    Uterine cancer Maternal Grandmother    Colon cancer Paternal Grandfather        70-72   Breast cancer Neg Hx      Current Outpatient Medications:    Cholecalciferol (VITAMIN D-1000 MAX ST) 25 MCG (1000 UT) tablet, Take 1,000 Units by mouth daily., Disp: , Rfl:    Cyanocobalamin (VITAMIN B-12 PO), Take 1 tablet by mouth daily at 6 (six) AM., Disp: , Rfl:    ketoconazole (NIZORAL) 2 % shampoo, apply topically x 3 times weekly to scalp and massage into scalp and leave in for 3 - 5 minutes before rinsing out. Can also use 3  x weekly as a face wash to aa of face (Patient taking differently: 1 Application 2 (two) times a week. apply topically x 3 times weekly to scalp and massage into scalp and leave in for 3 - 5 minutes before rinsing out. Can also use 3 x weekly as a face wash to aa of face), Disp: 120 mL, Rfl: 11   mometasone (ELOCON) 0.1 % lotion, Apply to affected skin 5 nights a week prn flares, Disp: 60 mL, Rfl: 6   Multiple Vitamin (MULTI-VITAMIN) tablet, Take 1 tablet by mouth daily., Disp: , Rfl:    valACYclovir (VALTREX) 500 MG tablet, Take 1 tablet (500 mg total) by mouth daily. Or BID for 3 days prn sx (Patient taking differently: Take 500 mg by mouth every evening.), Disp: 90 tablet, Rfl: 2   HYDROcodone-acetaminophen (NORCO/VICODIN) 5-325 MG tablet, Take 1 tablet by mouth every 6 (six) hours as needed for moderate pain. (Patient not taking: Reported on 04/11/2023), Disp: 15 tablet, Rfl: 0   ibuprofen (ADVIL) 800 MG tablet, Take 1 tablet (800 mg total) by mouth every 8 (eight) hours as  needed. (Patient not taking: Reported on 04/17/2023), Disp: 30 tablet, Rfl: 0   Physical exam:  Vitals:   04/17/23 1331  BP: 121/81  Pulse: 70  Resp: 18  Temp: (!) 97.2 F (36.2 C)  TempSrc: Tympanic  SpO2: 100%  Weight: 168 lb 4.8 oz (76.3 kg)  Height: 5\' 4"  (1.626 m)   Physical Exam Cardiovascular:     Rate and Rhythm: Normal rate and regular rhythm.     Heart sounds: Normal heart sounds.  Pulmonary:     Effort: Pulmonary effort is normal.     Breath sounds: Normal breath sounds.  Abdominal:     General: Bowel sounds are normal.     Palpations: Abdomen is soft.  Skin:    General: Skin is warm and dry.  Neurological:     Mental Status: She is alert and oriented to person, place, and time.   Breast exam was performed in seated and lying down position. Patient is status post right lumpectomy with a well-healed surgical scar. No evidence of any palpable masses. No evidence of axillary adenopathy. No evidence of any palpable masses or lumps in the left breast. No evidence of leftt axillary adenopathy        Latest Ref Rng & Units 03/24/2023    8:51 AM  CMP  Glucose 70 - 99 mg/dL 97   BUN 6 - 20 mg/dL 8   Creatinine 5.40 - 9.81 mg/dL 1.91   Sodium 478 - 295 mmol/L 138   Potassium 3.5 - 5.1 mmol/L 3.9   Chloride 98 - 111 mmol/L 108   CO2 22 - 32 mmol/L 25   Calcium 8.9 - 10.3 mg/dL 8.8   Total Protein 6.5 - 8.1 g/dL 6.4   Total Bilirubin 0.3 - 1.2 mg/dL 0.6   Alkaline Phos 38 - 126 U/L 37   AST 15 - 41 U/L 17   ALT 0 - 44 U/L 17       Latest Ref Rng & Units 03/24/2023    8:51 AM  CBC  WBC 4.0 - 10.5 K/uL 5.1   Hemoglobin 12.0 - 15.0 g/dL 62.1   Hematocrit 30.8 - 46.0 % 38.3   Platelets 150 - 400 K/uL 265     No images are attached to the encounter.  MM Breast Surgical Specimen  Result Date: 03/24/2023 CLINICAL DATA:  Status post [facility] localized RIGHT  breast lumpectomy. EXAM: SPECIMEN RADIOGRAPH OF THE RIGHT BREAST COMPARISON:  Prior studies FINDINGS:  Status post excision of the RIGHT breast. The [facility] reflector and ribbon shaped clip are present within the specimen. IMPRESSION: Specimen radiograph of the RIGHT breast. Electronically Signed   By: Norva Pavlov M.D.   On: 03/24/2023 16:00    Assessment and plan- Patient is a 47 y.o. female referred for atypical lobular hyperplasia of the right breast  Patient was found to have complex sclerosing adenosis on breast biopsy specimen that was done for abnormal microcalcifications found in the right breast.  Because complex sclerosing adenosis can at times be associated with invasive malignancy lumpectomy was the next best step.  Final pathology showed 15 mm complex sclerosing adenosis along with evidence of atypical lobular hyperplasia.  With regards to complex sclerosing adenosis she does not require any further surgical management or radiation treatment.  For atypical lobular hyperplasia it confers a moderate increased risk of subsequent breast cancer with a relative risk of 3-5 times as compared to general population. There is no role for chemotherapy or radiation after excision of ALH options at this time include active surveillance as well as chemoprevention.  In terms of breast cancer surveillance I would recommend breast exams every 6 months as well as continuing annual mammogram.  The role of breast MRI is unclear and MRI can be more sensitive but less specific.  Patient does not have any family history of breast cancer.  There is a risk of false positivity and unnecessary biopsies associated with MRI.  Patient would like to think about whether she wants to opt for MRIs alternating with mammograms or not.  In terms of chemoprevention given the patient is premenopausal aromatase inhibitors and SERM drugs like raloxifene are not an option for her.Tamoxifen has been studied in patients with high risk of breast cancer such as those with atypical lobular hyperplasia.  Tamoxifen was shown to result  in  reduction in the risk of invasive breast cancer (7 cases in 1000 women over five years; risk ratio [RR] 0.69, 95% CI 0.59-0.84). This was seen as a reduction in the risk of estrogen receptor-positive, but not estrogen receptor-negative, breast cancer. There was no difference noted in breast cancer specific or all cause mortality.  Discussed risks and benefits of tamoxifen including all but not limited to possible risk of increased thromboembolic events as well as endometrial cancer.  Her 5 cases in thousand women were noted for thromboembolic event with a 5-year course of tamoxifen.  For cases in thousand remained for incidence of endometrial cancer.  Both these are often age-related and increasing with advancing age  Typically the dose of tamoxifen is 20 mg daily for 5 years.  Lower dose strategies including 10 mg every other day or 5 mg daily is a reasonable alternative since we do not have data from longer-term follow-up for lower doses..  5 mg and 20 mg doses have not been directly compared in the low-dose tamoxifen was actually compared to placebo.  The relative reduction in invasive cancer for full dose tamoxifen and lower dose was comparable however.  Patient currently is following up with GYN for irregular menstrual bleeding and potentially may need an endometrial biopsy.  I will be getting in touch with her GYN provider Helmut Muster Copland to decide when would be a good time for her to start tamoxifen since patient is interested in trying it.  I will tentatively see her back in 2 months time for a  virtual visit.  Given personal history of atypical lobular hyperplasia, history of melanoma in her mother as well as uterine cancer in her family I am referring her to genetic counseling as well   Thank you for this kind referral and the opportunity to participate in the care of this patient   Visit Diagnosis 1. Atypical lobular hyperplasia (ALH) of right breast   2. Goals of care,  counseling/discussion     Dr. Owens Shark, MD, MPH [facility] [phone] 04/18/2023

## 2023-04-19 NOTE — Telephone Encounter (Signed)
F/u with MD

## 2023-04-26 NOTE — Progress Notes (Unsigned)
Jerl Mina, MD   No chief complaint on file.   HPI:      [name] is a 47 y.o. 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   Herpes simplex vulvovaginitis 02/01/2023    Past Surgical History:  Procedure Laterality Date   BREAST BIOPSY Right 03/06/2023   [place] Bx, Ribbon Clip, path pending   BREAST BIOPSY Right 03/06/2023   [place] RT BREAST BX W LOC DEV 1ST LESION IMG BX SPEC US GUIDE 03/06/2023 [facility]   BREAST BIOPSY WITH RADIO FREQUENCY LOCALIZER Right 03/24/2023   Procedure: BREAST BIOPSY WITH RADIO FREQUENCY LOCALIZER;  Surgeon: Campbell Lerner, MD;  Location: [facility] ORS;  Service: General;  Laterality: Right;   Cervical cryotherapy  04/13/1999   DILATION AND CURETTAGE OF UTERUS  09/2010   FRACTURE SURGERY     arm surgery    Family History  Problem Relation Age of Onset   Melanoma Mother        x3   Diabetes Maternal Grandmother    Uterine cancer Maternal Grandmother        dx 30s-40s   Colon cancer Paternal Grandfather        65-72   Breast cancer Neg Hx     Social History   Socioeconomic History   Marital status: Married    Spouse name: Not on file   Number of children: Not on file   Years of education: Not on file   Highest education level: Not on file  Occupational History   Not on file  Tobacco Use   Smoking status: Never    Passive exposure: Never   Smokeless tobacco: Never  Vaping Use   Vaping status: Never Used  Substance and Sexual Activity   Alcohol use: Yes    Comment: occ   Drug use: Never   Sexual activity: Yes    Birth control/protection: None, Surgical    Comment: Vasectomy  Other Topics Concern   Not on file  Social History Narrative   Not on file   Social Determinants of Health   Financial Resource Strain: Low Risk   (12/26/2022)   Received from [facility], [facility]   Overall Financial Resource Strain (CARDIA)    Difficulty of Paying Living Expenses: Not hard at all  Food Insecurity: No Food Insecurity (04/17/2023)   Hunger Vital Sign    Worried About Running Out of Food in the Last Year: Never true    Ran Out of Food in the Last Year: Never true  Transportation Needs: No Transportation Needs (04/17/2023)   PRAPARE - Administrator, Civil Service (Medical): No    Lack of Transportation (Non-Medical): No  Physical Activity: Sufficiently Active (12/19/2019)   Received from [facility], [facility]   Exercise Vital Sign    Days of Exercise per Week: 6 days    Minutes of Exercise per Session: 40 min  Stress: No Stress Concern Present (12/19/2019)   Received from [facility], [facility]   [employer] of Occupational Health - Occupational Stress Questionnaire    Feeling of Stress : Only a little  Social Connections: Moderately Integrated (12/19/2019)   Received from [facility], [facility]   Social Connection and Isolation Panel [NHANES]    Frequency of Communication with Friends and Family: Three times a week    Frequency of Social Gatherings with Friends and Family: Never    Attends Religious Services: More than 4 times per year    Active Member of [employer] or Organizations: No    Attends Banker Meetings: Never    Marital Status: Married  Catering manager Violence: Not At Risk (04/17/2023)   Humiliation, Afraid, Rape, and Kick questionnaire    Fear of Current or Ex-Partner: No    Emotionally Abused: No    Physically Abused: No    Sexually Abused: No    Outpatient Medications Prior to Visit  Medication Sig Dispense Refill   Cholecalciferol (VITAMIN D-1000 MAX ST) 25 MCG (1000 UT) tablet Take 1,000 Units by mouth daily.      Cyanocobalamin (VITAMIN B-12 PO) Take 1 tablet by mouth daily at 6 (six) AM.     HYDROcodone-acetaminophen (NORCO/VICODIN) 5-325 MG tablet Take 1 tablet by mouth every 6 (six) hours as needed for moderate pain. (Patient not taking: Reported on 04/11/2023) 15 tablet 0   ibuprofen (ADVIL) 800 MG tablet Take 1 tablet (800 mg total) by mouth every 8 (eight) hours as needed. (Patient not taking: Reported on 04/17/2023) 30 tablet 0   ketoconazole (NIZORAL) 2 % shampoo apply topically x 3 times weekly to scalp and massage into scalp and leave in for 3 - 5 minutes before rinsing out. Can also use 3 x weekly as a face wash to aa of face (Patient taking differently: 1 Application 2 (two) times a week. apply topically x 3 times weekly to scalp and massage into scalp and leave in for 3 - 5 minutes before rinsing out. Can also use 3 x weekly as a face wash to aa of face) 120 mL 11   mometasone (ELOCON) 0.1 % lotion Apply to affected skin 5 nights a week prn flares 60 mL 6   Multiple Vitamin (MULTI-VITAMIN) tablet Take 1 tablet by mouth daily.     valACYclovir (VALTREX) 500 MG tablet Take 1 tablet (500 mg total) by mouth daily. Or BID for 3 days prn sx (Patient taking differently: Take 500 mg by mouth every evening.) 90 tablet 2   No facility-administered medications prior to visit.      ROS:  Review of Systems BREAST: No symptoms   OBJECTIVE:   Vitals:  There were no vitals taken for this visit.  Physical Exam  Results: No results found for this or any previous visit (from the past 24 hour(s)).   Assessment/Plan: No diagnosis found.    No orders of the defined types were placed in this encounter.     No follow-ups on file.  Babak Lucus B. Janiel Derhammer, PA-C 04/26/2023 7:39 PM

## 2023-04-27 ENCOUNTER — Encounter: Payer: Self-pay | Admitting: Obstetrics and Gynecology

## 2023-04-27 ENCOUNTER — Ambulatory Visit (INDEPENDENT_AMBULATORY_CARE_PROVIDER_SITE_OTHER): Admitting: Obstetrics and Gynecology

## 2023-04-27 ENCOUNTER — Other Ambulatory Visit (HOSPITAL_COMMUNITY): Admission: RE | Admit: 2023-04-27 | Source: Ambulatory Visit

## 2023-04-27 VITALS — BP 97/62 | HR 66 | Ht 64.0 in | Wt 167.0 lb

## 2023-04-27 DIAGNOSIS — N939 Abnormal uterine and vaginal bleeding, unspecified: Secondary | ICD-10-CM | POA: Diagnosis not present

## 2023-04-27 DIAGNOSIS — R9389 Abnormal findings on diagnostic imaging of other specified body structures: Secondary | ICD-10-CM

## 2023-04-27 NOTE — Patient Instructions (Signed)
I value your feedback and you entrusting us with your care. If you get a [facility] patient survey, I would appreciate you taking the time to let us know about your experience today. Thank you! ? ? ?

## 2023-05-01 ENCOUNTER — Other Ambulatory Visit: Payer: Self-pay | Admitting: *Deleted

## 2023-05-01 LAB — SURGICAL PATHOLOGY

## 2023-05-01 MED ORDER — TAMOXIFEN CITRATE 20 MG PO TABS: 20.0000 mg | ORAL_TABLET | Freq: Every day | ORAL | 4 refills | Status: DC

## 2023-05-01 NOTE — Progress Notes (Signed)
EMB is negative.

## 2023-05-01 NOTE — Progress Notes (Signed)
Yes. I told her to keep me posted on her AUB but sx are most likely related to perimenopause.

## 2023-05-10 ENCOUNTER — Inpatient Hospital Stay (HOSPITAL_BASED_OUTPATIENT_CLINIC_OR_DEPARTMENT_OTHER): Admitting: Licensed Clinical Social Worker

## 2023-05-10 ENCOUNTER — Inpatient Hospital Stay

## 2023-05-10 ENCOUNTER — Encounter: Payer: Self-pay | Admitting: Licensed Clinical Social Worker

## 2023-05-10 DIAGNOSIS — Z803 Family history of malignant neoplasm of breast: Secondary | ICD-10-CM

## 2023-05-10 DIAGNOSIS — Z801 Family history of malignant neoplasm of trachea, bronchus and lung: Secondary | ICD-10-CM

## 2023-05-10 DIAGNOSIS — Z807 Family history of other malignant neoplasms of lymphoid, hematopoietic and related tissues: Secondary | ICD-10-CM

## 2023-05-10 DIAGNOSIS — Z8049 Family history of malignant neoplasm of other genital organs: Secondary | ICD-10-CM | POA: Diagnosis not present

## 2023-05-10 DIAGNOSIS — Z800 Family history of malignant neoplasm of digestive organs: Secondary | ICD-10-CM

## 2023-05-10 DIAGNOSIS — Z808 Family history of malignant neoplasm of other organs or systems: Secondary | ICD-10-CM

## 2023-05-10 NOTE — Progress Notes (Signed)
REFERRING PROVIDER: Creig Hines, MD [address] [place],  [place] [address]  PRIMARY PROVIDER:  Jerl Mina, MD  PRIMARY REASON FOR VISIT:  1. Family history of uterine cancer   2. Family history of lung cancer   3. Family history of colon cancer   4. Family history of liver cancer      HISTORY OF PRESENT ILLNESS:   [name], a 47 y.o. female, was seen for a [facility] cancer genetics consultation at the request of Dr. Smith Robert due to a family history of cancer.  Ms. Avey presents to clinic today to discuss the possibility of a hereditary predisposition to cancer, genetic testing, and to further clarify her future cancer risks, as well as potential cancer risks for family members.   CANCER HISTORY:  Ms. Lime is a 47 y.o. female with no personal history of cancer.    RISK FACTORS:  Menarche was at age 63.  First live birth at age 11.  Ovaries intact: yes.  Hysterectomy: no.  Menopausal status: premenopausal.  HRT use: 0 years. Colonoscopy: no; not examined. Mammogram within the last year: yes. Number of breast biopsies: 1. - [facility] Up to date with pelvic exams: yes.   Past Medical History:  Diagnosis Date   Abnormal vaginal Pap smear 2000   Anemia    after 2nd pregnancy only   Complex sclerosing lesion of right breast    Herpes genitalis    Hx of dysplastic nevus 03/09/2020   R medial side of foot. Moderate to severe atypia, peripheral margin involved. Excised 06/02/2020. Margins free.   Radial scar of breast     Past Surgical History:  Procedure Laterality Date   BREAST BIOPSY Right 03/06/2023   [place] Bx, Ribbon Clip, path pending   BREAST BIOPSY Right 03/06/2023   [place] RT BREAST BX W LOC DEV 1ST LESION IMG BX SPEC US GUIDE 03/06/2023 [facility]   BREAST BIOPSY WITH RADIO FREQUENCY LOCALIZER Right 03/24/2023   Procedure: BREAST BIOPSY WITH RADIO FREQUENCY LOCALIZER;  Surgeon: Campbell Lerner, MD;  Location: [facility];  Service: General;  Laterality: Right;    Cervical cryotherapy  04/13/1999   DILATION AND CURETTAGE OF UTERUS  09/2010   FRACTURE SURGERY     arm surgery    FAMILY HISTORY:  We obtained a detailed, 4-generation family history.  Significant diagnoses are listed below: Family History  Problem Relation Age of Onset   Skin cancer Mother        x3   Liver cancer Father 55   Lymphoma Paternal Uncle    Diabetes Maternal Grandmother    Uterine cancer Maternal Grandmother 28   Lung cancer Maternal Grandfather 88   Colon cancer Paternal Grandfather 88   Breast cancer Neg Hx    Ms. Weese has 2 sons, 2 daughters. She had 2 sisters, one passed at 31. No cancers.  Ms. Wadding mother has had skin cancer 3 times, not melanoma. She is living at 27. Maternal grandmother had uterine cancer at 27 and passed in her 39s. Maternal grandfather had metastatic lung cancer and passed in his 7s.  Ms. Sadri father has liver cancer, diagnosed at 67. Paternal grandfather had colon cancer at 3. Paternal uncle had lymphoma.  Ms. Ercolani is unaware of previous family history of genetic testing for hereditary cancer risks. There is no reported Ashkenazi Jewish ancestry. There is no known consanguinity.    GENETIC COUNSELING ASSESSMENT: Ms. Hogsed is a 47 y.o. female with a family history of early  onset uterine cancer which is somewhat suggestive of a hereditary cancer syndrome and predisposition to cancer. We, therefore, discussed and recommended the following at today's visit.   DISCUSSION: We discussed that approximately 10% of cancer is hereditary. Most cases of hereditary uterine/endometrial cancer are associated with Lynch syndrome genes, although there are other genes associated with hereditary cancer as well. Cancers and risks are gene specific. We discussed that testing is beneficial for several reasons including knowing about cancer risks, identifying potential screening and risk-reduction options that may be appropriate, and to understand if other  family members could be at risk for cancer and allow them to undergo genetic testing.   We reviewed the characteristics, features and inheritance patterns of hereditary cancer syndromes. We also discussed genetic testing, including the appropriate family members to test, the process of testing, insurance coverage and turn-around-time for results. We discussed the implications of a negative, positive and/or variant of uncertain significant result. We recommended Ms. Eppes pursue genetic testing for the [facility] Multi-Cancer+RNA gene panel.   Based on Ms. Peltz's family history of cancer, she meets medical criteria for genetic testing. Despite that she meets criteria, she may still have an out of pocket cost. \  PLAN: After considering the risks, benefits, and limitations, Ms. Easterwood provided informed consent to pursue genetic testing and the blood sample was sent to [facility] for analysis of the Multi-Cancer+RNA panel. Results should be available within approximately 2-3 weeks' time, at which point they will be disclosed by MyChart message to Ms. Mochizuki, as will any additional recommendations warranted by these results. Ms. Oke will receive a summary of her genetic counseling visit and a copy of her results once available. This information will also be available in Epic.   Ms. Riesgo questions were answered to her satisfaction today. Our contact information was provided should additional questions or concerns arise. Thank you for the referral and allowing [place] to share in the care of your patient.   Lacy Duverney, MS, [facility] Genetic Counselor [email or web address] Phone: [phone]  The patient was seen for a total of 25 minutes in face-to-face genetic counseling.  Dr. Blake Divine was available for discussion regarding this case.   _______________________________________________________________________ For Office Staff:  Number of people involved in session: 1 Was an Intern/  student involved with case: no

## 2023-05-16 ENCOUNTER — Encounter: Payer: Self-pay | Admitting: Licensed Clinical Social Worker

## 2023-05-16 ENCOUNTER — Ambulatory Visit: Payer: Self-pay | Admitting: Licensed Clinical Social Worker

## 2023-05-16 ENCOUNTER — Telehealth: Payer: Self-pay | Admitting: Licensed Clinical Social Worker

## 2023-05-16 DIAGNOSIS — Z1379 Encounter for other screening for genetic and chromosomal anomalies: Secondary | ICD-10-CM | POA: Insufficient documentation

## 2023-05-16 NOTE — Telephone Encounter (Signed)
I contacted Ms. Granzow to discuss her genetic testing results. No pathogenic variants were identified in the genes 70 analyzed. Detailed clinic note to follow.   The test report has been scanned into EPIC and is located under the Molecular Pathology section of the Results Review tab.  A portion of the result report is included below for reference.      Lacy Duverney, MS, [facility] Genetic Counselor [email or web address] Phone: [phone]

## 2023-05-16 NOTE — Progress Notes (Signed)
HPI:   Penny Tapia was previously seen in the [facility] due to a family history of cancer and concerns regarding a hereditary predisposition to cancer. Please refer to our prior cancer genetics clinic note for more information regarding our discussion, assessment and recommendations, at the time. Penny Tapia recent genetic test results were disclosed to her, as were recommendations warranted by these results. These results and recommendations are discussed in more detail below.  CANCER HISTORY:  Oncology History   No history exists.    FAMILY HISTORY:  We obtained a detailed, 4-generation family history.  Significant diagnoses are listed below: Family History  Problem Relation Age of Onset   Skin cancer Mother        x3   Liver cancer Father 28   Lymphoma Paternal Uncle    Diabetes Maternal Grandmother    Uterine cancer Maternal Grandmother 28   Lung cancer Maternal Grandfather 53   Colon cancer Paternal Grandfather 80   Breast cancer Neg Hx     Penny Tapia has 2 sons, 2 daughters. She had 2 sisters, one passed at 55. No cancers.   Penny Tapia mother has had skin cancer 3 times, not melanoma. She is living at 32. Maternal grandmother had uterine cancer at 64 and passed in her 19s. Maternal grandfather had metastatic lung cancer and passed in his 54s.   Penny Tapia father has liver cancer, diagnosed at 62. Paternal grandfather had colon cancer at 35. Paternal uncle had lymphoma.   Penny Tapia is unaware of previous family history of genetic testing for hereditary cancer risks. There is no reported Ashkenazi Jewish ancestry. There is no known consanguinity.     GENETIC TEST RESULTS:  The [facility] Multi-Cancer+RNA Panel found no pathogenic mutations.   The Multi-Cancer + RNA Panel offered by [facility] includes sequencing and/or deletion/duplication analysis of the following 70 genes:  AIP*, ALK, APC*, ATM*, AXIN2*, BAP1*, BARD1*, BLM*, BMPR1A*, BRCA1*, BRCA2*, BRIP1*,  CDC73*, CDH1*, CDK4, CDKN1B*, CDKN2A, CHEK2*, CTNNA1*, DICER1*, EPCAM, EGFR, FH*, FLCN*, GREM1, HOXB13, KIT, LZTR1, MAX*, MBD4, MEN1*, MET, MITF, MLH1*, MSH2*, MSH3*, MSH6*, MUTYH*, NF1*, NF2*, NTHL1*, PALB2*, PDGFRA, PMS2*, POLD1*, POLE*, POT1*, PRKAR1A*, PTCH1*, PTEN*, RAD51C*, RAD51D*, RB1*, RET, SDHA*, SDHAF2*, SDHB*, SDHC*, SDHD*, SMAD4*, SMARCA4*, SMARCB1*, SMARCE1*, STK11*, SUFU*, TMEM127*, TP53*, TSC1*, TSC2*, VHL*. RNA analysis is performed for * genes.   The test report has been scanned into EPIC and is located under the Molecular Pathology section of the Results Review tab.  A portion of the result report is included below for reference. Genetic testing reported out on 05/16/2023.     Even though a pathogenic variant was not identified, possible explanations for the cancer in the family may include: There may be no hereditary risk for cancer in the family. The cancers in Penny Tapia and/or her family may be sporadic/familial or due to other genetic and environmental factors. There may be a gene mutation in one of these genes that current testing methods cannot detect but that chance is small. There could be another gene that has not yet been discovered, or that we have not yet tested, that is responsible for the cancer diagnoses in the family.  It is also possible there is a hereditary cause for the cancer in the family that Penny Tapia did not inherit. Therefore, it is important to remain in touch with cancer genetics in the future so that we can continue to offer Penny Tapia the most up to date genetic testing.   ADDITIONAL  GENETIC TESTING:  We discussed with Ms. Posch that her genetic testing was fairly extensive.  If there are additional relevant genes identified to increase cancer risk that can be analyzed in the future, we would be happy to discuss and coordinate this testing at that time.    CANCER SCREENING RECOMMENDATIONS:  Penny Tapia test result is considered negative (normal).  This  means that we have not identified a hereditary cause for her family history of cancer at this time.   An individual's cancer risk and medical management are not determined by genetic test results alone. Overall cancer risk assessment incorporates additional factors, including personal medical history, family history, and any available genetic information that may result in a personalized plan for cancer prevention and surveillance. Therefore, it is recommended she continue to follow the cancer management and screening guidelines provided by her  primary healthcare provider.  Based on the reported personal and family history, specific cancer screenings for Penny Tapia and her family include:   Breast Cancer Screening:  The Tyrer-Cuzick model is one of multiple prediction models developed to estimate an individual's lifetime risk of developing breast cancer. The Tyrer-Cuzick model is endorsed by the [employer] (NCCN). This model includes many risk factors such as family history, endogenous estrogen exposure, and benign breast disease. The calculation is highly-dependent on the accuracy of clinical data provided by the patient and can change over time. The Tyrer-Cuzick model may be repeated to reflect new information in her personal or family history in the future.    Penny Tapia Tyrer-Cuzick risk score is 26.8%.  For women with a greater than 20% lifetime risk of breast cancer, the NCCN recommends the following:    1.   Clinical encounter every 6-12 months to begin when identified as being at increased risk, but not before age 48    2.   Annual mammograms, tomosynthesis is recommended starting 10 years earlier than the youngest breast cancer diagnosis in the family or at age 63 (whichever comes first), but not before age 48     73.   Annual breast MRI starting 10 years earlier than the youngest breast cancer diagnosis in the family or at age 33 (whichever comes first),  but not before age 28    Penny Tapia is followed by Dr. Smith Robert as high risk due to her [facility].     RECOMMENDATIONS FOR FAMILY MEMBERS:   Since she did not inherit a identifiable mutation in a cancer predisposition gene included on this panel, her children could not have inherited a known mutation from her in one of these genes. Individuals in this family might be at some increased risk of developing cancer, over the general population risk, due to the family history of cancer.  Individuals in the family should notify their providers of the family history of cancer. We recommend women in this family have a yearly mammogram beginning at age 41, or 39 years younger than the earliest onset of cancer, an annual clinical breast exam, and perform monthly breast self-exams.  Family members should have colonoscopies by at age 97, or earlier, as recommended by their providers. Other members of the family may still carry a pathogenic variant in one of these genes that Penny Tapia did not inherit. Based on the family history, we recommend maternal relatives have genetic counseling and testing (based on maternal grandmother's young onset endometrial cancer). Penny Tapia will let us know if we can be of any assistance in coordinating  genetic  FOLLOW-UP:  Lastly, we discussed with Penny Tapia that cancer genetics is a rapidly advancing field and it is possible that new genetic tests will be appropriate for her and/or her family members in the future. We encouraged her to remain in contact with cancer genetics on an annual basis so we can update her personal and family histories and let her know of advances in cancer genetics that may benefit this family.   Our contact number was provided. Penny Tapia questions were answered to her satisfaction, and she knows she is welcome to call us at anytime with additional questions or concerns.    Lacy Duverney, MS, [facility] Genetic Counselor [email or web address] Phone:  [phone]

## 2023-06-16 ENCOUNTER — Telehealth: Admitting: Oncology

## 2023-06-16 ENCOUNTER — Inpatient Hospital Stay: Attending: Oncology | Admitting: Oncology

## 2023-06-16 ENCOUNTER — Encounter: Payer: Self-pay | Admitting: Oncology

## 2023-06-16 DIAGNOSIS — N6091 Unspecified benign mammary dysplasia of right breast: Secondary | ICD-10-CM

## 2023-06-16 DIAGNOSIS — N6081 Other benign mammary dysplasias of right breast: Secondary | ICD-10-CM | POA: Diagnosis not present

## 2023-06-16 MED ORDER — TAMOXIFEN CITRATE 10 MG PO TABS: 10.0000 mg | ORAL_TABLET | ORAL | 5 refills | Status: DC

## 2023-06-16 NOTE — Progress Notes (Signed)
I connected with Penny Tapia on 06/16/23 at  9:45 AM EDT by video enabled telemedicine visit and verified that I am speaking with the correct person using two identifiers.   I discussed the limitations, risks, security and privacy concerns of performing an evaluation and management service by telemedicine and the availability of in-person appointments. I also discussed with the patient that there may be a patient responsible charge related to this service. The patient expressed understanding and agreed to proceed.  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  This was followed by diagnostic mammogram and ultrasound which showed 1.4 x 0.4 x 1 cm group of microcalcifications at the 2 o'clock position 9 cm from the nipple.  No evidence of right axillary adenopathy.  This was biopsy and was consistent with complex sclerosing lesion.  Patient was seen by Dr. Claudine Mouton and underwent definitive lumpectomy which showed complex sclerosing lesion 15 mm with usual ductal hyperplasia and atypical lobular hyperplasia.  Extensive adenosis with calcifications and ALH.  Additional superior lateral margin also showed usual ductal hyperplasia apocrine metaplasia and sclerosing adenosis.  Negative for atypia and malignancy.  Patient has been referred for further management   Patient is being followed by GYN and was being evaluated for abnormal uterine bleeding.  She had endometrial biopsy which was negative and therefore patient started taking tamoxifen 20 mg daily in September 2024.  Genetic testing negative.   Interval history patient  reports episodes of feeling hot after starting tamoxifen.  She has also noticed some joint pain including wrist pain and fatigue.   Review of Systems  Constitutional:  Positive for malaise/fatigue. Negative for chills, fever and weight loss.  HENT:  Negative for congestion, ear discharge and nosebleeds.   Eyes:  Negative for blurred vision.  Respiratory:  Negative for cough, hemoptysis, sputum production, shortness of breath and wheezing.   Cardiovascular:  Negative for chest pain, palpitations, orthopnea and claudication.  Gastrointestinal:  Negative for abdominal pain, blood in stool, constipation, diarrhea, heartburn, melena, nausea and vomiting.  Genitourinary:  Negative for dysuria, flank pain, frequency, hematuria and urgency.  Musculoskeletal:  Positive for joint pain. Negative for back pain and myalgias.  Skin:  Negative for rash.  Neurological:  Negative for dizziness, tingling, focal weakness, seizures, weakness and headaches.  Endo/Heme/Allergies:  Does not bruise/bleed easily.  Psychiatric/Behavioral:  Negative for depression and suicidal ideas. The patient does not have insomnia.     No Known Allergies  Past Medical History:  Diagnosis Date   Abnormal vaginal Pap smear 2000   Anemia    after 2nd pregnancy only   Complex sclerosing lesion of right breast    Herpes genitalis    Hx of dysplastic nevus 03/09/2020   R medial side of foot. Moderate to severe atypia, peripheral margin involved. Excised 06/02/2020. Margins free.   Radial scar of breast     Past Surgical History:  Procedure Laterality Date   BREAST BIOPSY Right 03/06/2023   [place] Bx, Ribbon Clip, path pending   BREAST BIOPSY Right 03/06/2023   [place] RT BREAST BX W LOC DEV 1ST LESION IMG BX SPEC US GUIDE 03/06/2023 [facility]   BREAST BIOPSY  WITH RADIO FREQUENCY LOCALIZER Right 03/24/2023   Procedure: BREAST BIOPSY WITH RADIO FREQUENCY LOCALIZER;  Surgeon: Campbell Lerner, MD;  Location: [facility];  Service:  General;  Laterality: Right;   Cervical cryotherapy  04/13/1999   DILATION AND CURETTAGE OF UTERUS  09/2010   FRACTURE SURGERY     arm surgery    Social History   Socioeconomic History   Marital status: Married    Spouse name: Not on file   Number of children: Not on file   Years of education: Not on file   Highest education level: Not on file  Occupational History   Not on file  Tobacco Use   Smoking status: Never    Passive exposure: Never   Smokeless tobacco: Never  Vaping Use   Vaping status: Never Used  Substance and Sexual Activity   Alcohol use: Yes    Comment: occ   Drug use: Never   Sexual activity: Yes    Birth control/protection: None, Surgical    Comment: Vasectomy  Other Topics Concern   Not on file  Social History Narrative   Not on file   Social Determinants of Health   Financial Resource Strain: Low Risk  (12/26/2022)   Received from [facility], [facility]   Overall Financial Resource Strain (CARDIA)    Difficulty of Paying Living Expenses: Not hard at all  Food Insecurity: No Food Insecurity (04/17/2023)   Hunger Vital Sign    Worried About Running Out of Food in the Last Year: Never true    Ran Out of Food in the Last Year: Never true  Transportation Needs: No Transportation Needs (04/17/2023)   PRAPARE - Administrator, Civil Service (Medical): No    Lack of Transportation (Non-Medical): No  Physical Activity: Sufficiently Active (12/19/2019)   Received from [facility], [facility]   Exercise Vital Sign    Days of Exercise per Week: 6 days    Minutes of Exercise per Session: 40 min  Stress: No Stress Concern Present (12/19/2019)   Received from [facility], [facility]   [employer] of Occupational Health - Occupational Stress Questionnaire    Feeling of Stress : Only a little  Social Connections: Moderately Integrated  (12/19/2019)   Received from [facility], [facility]   Social Connection and Isolation Panel [NHANES]    Frequency of Communication with Friends and Family: Three times a week    Frequency of Social Gatherings with Friends and Family: Never    Attends Religious Services: More than 4 times per year    Active Member of [employer] or Organizations: No    Attends Banker Meetings: Never    Marital Status: Married  Catering manager Violence: Not At Risk (04/17/2023)   Humiliation, Afraid, Rape, and Kick questionnaire    Fear of Current or Ex-Partner: No    Emotionally Abused: No    Physically Abused: No    Sexually Abused: No    Family History  Problem Relation Age of Onset   Skin cancer Mother        x3   Liver cancer Father 80   Lymphoma Paternal Uncle    Diabetes Maternal Grandmother    Uterine cancer Maternal Grandmother 28   Lung cancer Maternal Grandfather 66   Colon cancer Paternal Grandfather 84   Breast cancer Neg Hx      Current Outpatient Medications:  Cholecalciferol (VITAMIN D-1000 MAX ST) 25 MCG (1000 UT) tablet, Take 1,000 Units by mouth daily., Disp: , Rfl:    Cyanocobalamin (VITAMIN B-12 PO), Take 1 tablet by mouth daily at 6 (six) AM., Disp: , Rfl:    ibuprofen (ADVIL) 800 MG tablet, Take 1 tablet (800 mg total) by mouth every 8 (eight) hours as needed. (Patient taking differently: Take 800 mg by mouth as needed.), Disp: 30 tablet, Rfl: 0   ketoconazole (NIZORAL) 2 % shampoo, apply topically x 3 times weekly to scalp and massage into scalp and leave in for 3 - 5 minutes before rinsing out. Can also use 3 x weekly as a face wash to aa of face (Patient taking differently: 1 Application 2 (two) times a week. apply topically x 3 times weekly to scalp and massage into scalp and leave in for 3 - 5 minutes before rinsing out. Can also use 3 x weekly as a face wash to aa of face), Disp: 120 mL, Rfl: 11   mometasone (ELOCON) 0.1 %  lotion, Apply to affected skin 5 nights a week prn flares, Disp: 60 mL, Rfl: 6   Multiple Vitamin (MULTI-VITAMIN) tablet, Take 1 tablet by mouth daily., Disp: , Rfl:    tamoxifen (NOLVADEX) 10 MG tablet, Take 1 tablet (10 mg total) by mouth every other day., Disp: 30 tablet, Rfl: 5   valACYclovir (VALTREX) 500 MG tablet, Take 1 tablet (500 mg total) by mouth daily. Or BID for 3 days prn sx (Patient taking differently: Take 500 mg by mouth every evening.), Disp: 90 tablet, Rfl: 2  No results found.  No images are attached to the encounter.      Latest Ref Rng & Units 03/24/2023    8:51 AM  CMP  Glucose 70 - 99 mg/dL 97   BUN 6 - 20 mg/dL 8   Creatinine 9.56 - 2.13 mg/dL 0.86   Sodium 578 - 469 mmol/L 138   Potassium 3.5 - 5.1 mmol/L 3.9   Chloride 98 - 111 mmol/L 108   CO2 22 - 32 mmol/L 25   Calcium 8.9 - 10.3 mg/dL 8.8   Total Protein 6.5 - 8.1 g/dL 6.4   Total Bilirubin 0.3 - 1.2 mg/dL 0.6   Alkaline Phos 38 - 126 U/L 37   AST 15 - 41 U/L 17   ALT 0 - 44 U/L 17       Latest Ref Rng & Units 03/24/2023    8:51 AM  CBC  WBC 4.0 - 10.5 K/uL 5.1   Hemoglobin 12.0 - 15.0 g/dL 62.9   Hematocrit 52.8 - 46.0 % 38.3   Platelets 150 - 400 K/uL 265      Observation/objective: Appears in no acute distress over video visit today.  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  Have asked her to lower the dose to 10 mg every other day and see if she can tolerate it better.  Mammograms will be done on a yearly basis and she would not be due for one until June 2025.  The data for alternating MRIs with mammograms is unclear.  Her genetic testing was also negative.  We could consider getting MRIs if it is approved by her  insurance.  Patient would like to hold off on MRI at this time.  I will see her back in 4 months for breast exam  Follow-up instructions: As above  I discussed the assessment and treatment plan with the patient. The patient was provided an opportunity to ask questions and all were answered. The patient agreed with the plan and demonstrated an understanding of the instructions.   The patient was advised to call back or seek an in-person evaluation if the symptoms worsen or if the condition fails to improve as anticipated.  I provided 11 minutes of face-to-face video visit time during this encounter including review of genetic counseling visit, ongoing side effects of drug and formulating future plan   visit Diagnosis: 1. Atypical lobular hyperplasia [facility]) of right breast     Dr. Owens Shark, MD, MPH [facility] at [facility] Tel- [phone] 06/16/2023 12:24 PM

## 2023-07-13 ENCOUNTER — Encounter: Payer: Self-pay | Admitting: Oncology

## 2023-07-14 ENCOUNTER — Other Ambulatory Visit: Payer: Self-pay | Admitting: *Deleted

## 2023-07-14 DIAGNOSIS — N6091 Unspecified benign mammary dysplasia of right breast: Secondary | ICD-10-CM

## 2023-07-14 NOTE — Telephone Encounter (Signed)
Please schedule mri b/l breast with and without contrast for early December based on insurance approval

## 2023-07-21 ENCOUNTER — Other Ambulatory Visit

## 2023-08-14 ENCOUNTER — Ambulatory Visit
Admission: RE | Admit: 2023-08-14 | Discharge: 2023-08-14 | Disposition: A | Discharge status: Home / Self Care | Source: Ambulatory Visit | Attending: Oncology | Admitting: Oncology

## 2023-08-14 DIAGNOSIS — N6091 Unspecified benign mammary dysplasia of right breast: Secondary | ICD-10-CM | POA: Diagnosis present

## 2023-08-14 MED ORDER — GADOBUTROL 1 MMOL/ML IV SOLN
7.0000 mL | Freq: Once | INTRAVENOUS | Status: AC | PRN
  Administered 2023-08-14: 7 mL via INTRAVENOUS

## 2023-08-15 ENCOUNTER — Encounter: Payer: Self-pay | Admitting: *Deleted

## 2023-08-15 ENCOUNTER — Other Ambulatory Visit: Payer: Self-pay | Admitting: Oncology

## 2023-08-15 DIAGNOSIS — N6091 Unspecified benign mammary dysplasia of right breast: Secondary | ICD-10-CM

## 2023-08-15 NOTE — Progress Notes (Signed)
Spoke to Penny Tapia, she is willing to proceed with MR breast biopsy.  Order placed.  She will let us know if she doesn't here anything by the end of the week.

## 2023-09-11 ENCOUNTER — Ambulatory Visit
Admission: RE | Admit: 2023-09-11 | Discharge: 2023-09-11 | Disposition: A | Discharge status: Home / Self Care | Source: Ambulatory Visit | Attending: Oncology | Admitting: Oncology

## 2023-09-11 DIAGNOSIS — N6091 Unspecified benign mammary dysplasia of right breast: Secondary | ICD-10-CM

## 2023-09-11 MED ORDER — GADOPICLENOL 0.5 MMOL/ML IV SOLN
7.0000 mL | Freq: Once | INTRAVENOUS | Status: AC | PRN
  Administered 2023-09-11: 7 mL via INTRAVENOUS

## 2023-09-12 LAB — SURGICAL PATHOLOGY

## 2023-09-15 ENCOUNTER — Other Ambulatory Visit: Payer: Self-pay | Admitting: Nurse Practitioner

## 2023-09-15 DIAGNOSIS — N6091 Unspecified benign mammary dysplasia of right breast: Secondary | ICD-10-CM

## 2023-09-15 DIAGNOSIS — N6489 Other specified disorders of breast: Secondary | ICD-10-CM

## 2023-09-17 HISTORY — PX: BREAST BIOPSY: SHX20

## 2023-10-04 NOTE — H&P (View-Only) (Signed)
[facility] POST-OP OFFICE VISIT  10/05/2023  HPI: Penny Tapia is a 48 y.o. female  s/p tag localized upper medial right breast lumpectomy/excisional biopsy, March 24, 2023.  Taking Tamoxifen 10 mg every other day.  Surveillence MRI as reported below, followed by MRI VABx of 6:00 1.4 cm area.   Now with separate site of CSL as detected by her 1st MRI.  She presents today with her husband present, and interactive/involved.  Diagnosis Current:  1. Breast, right, needle core biopsy, 6 o'clock, mid depth :       -  COMPLEX SCLEROSING LESION   Prior from last year: 1. 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 - EXTENSIVE ADENOSIS WITH CALCIFICATIONS AND ALH. - BIOPSY SITE CHANGE AND CLIP PRESENT. - RF IG TAG IN PLACE. 2. Breast, excision, additional superior lateral margin, right - USUAL DUCTAL HYPERPLASIA, APOCRINE METAPLASIA, COLUMNAR CELL CHANGE, AND SCLEROSING ADENOSIS. - NEGATIVE FOR ATYPIA AND MALIGNANCY.  Vital signs: BP 116/80   Pulse 73   Temp 98.2 F (36.8 C)   Ht 5\' 4"  (1.626 m)   Wt 166 lb (75.3 kg)   LMP 09/19/2023 (Exact Date)   SpO2 98%   BMI 28.49 kg/m    Physical Exam: Constitutional: She appears well. Chest CTA Cor: RRR Skin: Upper medial quadrant right breast incision,  incision appears to have fully healed, however a better scar would have been preferred.  Biopsy focus is outer lower quadrant right breast, skin is healed without evidence of ecchymosis or erythema.  Imaging:  CLINICAL DATA:  History of a complex sclerosing lesion and atypical lobular hyperplasia excised from the medial aspect of the right breast on 03/24/2023.   EXAM: BILATERAL BREAST MRI WITH AND WITHOUT CONTRAST   TECHNIQUE: Multiplanar, multisequence MR images of both breasts were obtained prior to and following the intravenous administration of 7 ml  of Gadavist   Three-dimensional MR images were rendered by post-processing of the original MR data on an independent workstation. The three-dimensional MR images were interpreted, and findings are reported in the following complete MRI report for this study. Three dimensional images were evaluated at the independent interpreting workstation using the DynaCAD thin client.   COMPARISON:  Previous exam(s).   FINDINGS: Breast composition: b. Scattered fibroglandular tissue.   Background parenchymal enhancement: Moderate.   Right breast: Postoperative changes are seen in the medial aspect of the right breast. In the 6 o'clock region of the right breast there is indeterminate linear enhancement measuring 1.4 cm (image 34 series 8).   Left breast: No mass or abnormal enhancement.   Lymph nodes: No abnormal appearing lymph nodes.   Ancillary findings:  None.   IMPRESSION: Indeterminate linear enhancement in the 6 o'clock region of the right breast spanning 1.4 cm (image 34 series 8).   RECOMMENDATION: MR guided core biopsy of the linear enhancement in the 6 o'clock region of the right breast is recommended.   BI-RADS CATEGORY  4: Suspicious.     Electronically Signed   By: Baird Lyons M.D.   On: 08/14/2023 15:34  Assessment/Plan: This is a 48 y.o. female s/p excision of complex sclerosing lesion of right breast, with biopsy revealing ALH, UIQ.   NOW with another VABx at 6:00 with CSL, in the same right breast, as this was found on MRI and having been her first MRI it is difficult to determine at what point this may  have developed.   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  I discussed the available options with the  patient.  We discussed with the vacuum assisted biopsy technique, the conversion to something more significant than CSL is likely less than it had been on historic studies.  However she remains somewhat anxious over this lesion, and pursuing peace of mind desires to proceed with excisional biopsy. I discussed risks of bleeding, infection, damage to surrounding tissues, needing further resection; as well as anesthesia risks.  Patient was given the opportunity to ask questions and have them answered.  They would like to proceed with right breast Scout tag localized excisional biopsy.    Imaging Surveillance As women with AH or LCIS are known to be at increased risk for breast cancer, close imaging surveillance should be considered. In addition to screening mammograms per recommended guidelines, supplemental screening with breast MRI may be performed in keeping with American Cancer Society guidelines 117 and NCCN guidelines, 28 which support screening MRI for a lifetime risk of 20% or greater, calculated using a family history-based model (such as the IBIS model, which applies to women with either AH or LCIS).  Campbell Lerner M.D., FACS 10/05/2023, 10:24 AM

## 2023-10-04 NOTE — Progress Notes (Unsigned)
[facility] POST-OP OFFICE VISIT  10/05/2023  HPI: Penny Tapia is a 48 y.o. female  s/p tag localized upper medial right breast lumpectomy/excisional biopsy, March 24, 2023.  Taking Tamoxifen 10 mg every other day.  Surveillence MRI as reported below, followed by MRI VABx of 6:00 1.4 cm area.   Now with separate site of CSL as detected by her 1st MRI.  She presents today with her husband present, and interactive/involved.  Diagnosis Current:  1. Breast, right, needle core biopsy, 6 o'clock, mid depth :       -  COMPLEX SCLEROSING LESION   Prior from last year: 1. 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 - EXTENSIVE ADENOSIS WITH CALCIFICATIONS AND ALH. - BIOPSY SITE CHANGE AND CLIP PRESENT. - RF IG TAG IN PLACE. 2. Breast, excision, additional superior lateral margin, right - USUAL DUCTAL HYPERPLASIA, APOCRINE METAPLASIA, COLUMNAR CELL CHANGE, AND SCLEROSING ADENOSIS. - NEGATIVE FOR ATYPIA AND MALIGNANCY.  Vital signs: BP 116/80   Pulse 73   Temp 98.2 F (36.8 C)   Ht 5\' 4"  (1.626 m)   Wt 166 lb (75.3 kg)   LMP 09/19/2023 (Exact Date)   SpO2 98%   BMI 28.49 kg/m    Physical Exam: Constitutional: She appears well. Chest CTA Cor: RRR Skin: Upper medial quadrant right breast incision,  incision appears to have fully healed, however a better scar would have been preferred.  Biopsy focus is outer lower quadrant right breast, skin is healed without evidence of ecchymosis or erythema.  Imaging:  CLINICAL DATA:  History of a complex sclerosing lesion and atypical lobular hyperplasia excised from the medial aspect of the right breast on 03/24/2023.   EXAM: BILATERAL BREAST MRI WITH AND WITHOUT CONTRAST   TECHNIQUE: Multiplanar, multisequence MR images of both breasts were obtained prior to and following the intravenous administration of 7 ml  of Gadavist   Three-dimensional MR images were rendered by post-processing of the original MR data on an independent workstation. The three-dimensional MR images were interpreted, and findings are reported in the following complete MRI report for this study. Three dimensional images were evaluated at the independent interpreting workstation using the DynaCAD thin client.   COMPARISON:  Previous exam(s).   FINDINGS: Breast composition: b. Scattered fibroglandular tissue.   Background parenchymal enhancement: Moderate.   Right breast: Postoperative changes are seen in the medial aspect of the right breast. In the 6 o'clock region of the right breast there is indeterminate linear enhancement measuring 1.4 cm (image 34 series 8).   Left breast: No mass or abnormal enhancement.   Lymph nodes: No abnormal appearing lymph nodes.   Ancillary findings:  None.   IMPRESSION: Indeterminate linear enhancement in the 6 o'clock region of the right breast spanning 1.4 cm (image 34 series 8).   RECOMMENDATION: MR guided core biopsy of the linear enhancement in the 6 o'clock region of the right breast is recommended.   BI-RADS CATEGORY  4: Suspicious.     Electronically Signed   By: Baird Lyons M.D.   On: 08/14/2023 15:34  Assessment/Plan: This is a 48 y.o. female s/p excision of complex sclerosing lesion of right breast, with biopsy revealing ALH, UIQ.   NOW with another VABx at 6:00 with CSL, in the same right breast, as this was found on MRI and having been her first MRI it is difficult to determine at what point this may  have developed.   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  I discussed the available options with the  patient.  We discussed with the vacuum assisted biopsy technique, the conversion to something more significant than CSL is likely less than it had been on historic studies.  However she remains somewhat anxious over this lesion, and pursuing peace of mind desires to proceed with excisional biopsy. I discussed risks of bleeding, infection, damage to surrounding tissues, needing further resection; as well as anesthesia risks.  Patient was given the opportunity to ask questions and have them answered.  They would like to proceed with right breast Scout tag localized excisional biopsy.    Imaging Surveillance As women with AH or LCIS are known to be at increased risk for breast cancer, close imaging surveillance should be considered. In addition to screening mammograms per recommended guidelines, supplemental screening with breast MRI may be performed in keeping with American Cancer Society guidelines 117 and NCCN guidelines, 28 which support screening MRI for a lifetime risk of 20% or greater, calculated using a family history-based model (such as the IBIS model, which applies to women with either AH or LCIS).  Campbell Lerner M.D., FACS 10/05/2023, 10:24 AM

## 2023-10-05 ENCOUNTER — Encounter: Payer: Self-pay | Admitting: Surgery

## 2023-10-05 ENCOUNTER — Other Ambulatory Visit: Payer: Self-pay | Admitting: Surgery

## 2023-10-05 ENCOUNTER — Ambulatory Visit: Admitting: Surgery

## 2023-10-05 ENCOUNTER — Ambulatory Visit: Payer: Self-pay | Admitting: Surgery

## 2023-10-05 VITALS — BP 116/80 | HR 73 | Temp 98.2°F | Ht 64.0 in | Wt 166.0 lb

## 2023-10-05 DIAGNOSIS — N6489 Other specified disorders of breast: Secondary | ICD-10-CM

## 2023-10-05 NOTE — Patient Instructions (Signed)
We have spoken today about removing a lump in your breast. This will be done by Dr. Claudine Mouton at [facility]  You will most likely be able to leave the hospital several hours after your surgery.   Plan to tentatively be off work for 1 week following the surgery and may return with approximately 2 more weeks of a lifting restriction, no greater than 15 lbs.  Please see your Blue surgery sheet for more information. Our surgery scheduler will call you to look at surgery dates and to go over information.   If you have FMLA or Disability paperwork that needs to be filled out, please have your company fax your paperwork to [phone] or you may drop this by either office. This paperwork will be filled out within 3 days after your surgery has been completed.  What is radio frequency localization of the breast?(RFID) RFID/Scout tag localization uses radiofrequency technology to accurately pinpoint the tumor. Seeing exactly where the tumor is before surgery helps surgeons more effectively remove the entire tumor and spare surrounding healthy breast tissue.    Lumpectomy A lumpectomy is a form of "breast conserving" or "breast preservation" surgery. During a lumpectomy, the portion of the breast that contains the breast mass (the lump) is removed. Some normal tissue around the lump may also be removed to make sure all of the tumor has been removed.  LET [facility] CARE PROVIDER KNOW ABOUT: Any allergies you have. All medicines you are taking, including vitamins, herbs, eye drops, creams, and over-the-counter medicines. Previous problems you or members of your family have had with the use of anesthetics. Any blood disorders you have. Previous surgeries you have had. Medical conditions you have. RISKS AND COMPLICATIONS Generally, this is a safe procedure. However, problems can occur and include: Bleeding. Infection. Pain. Temporary swelling. Change in the shape of the breast, particularly if a large  portion is removed. BEFORE THE PROCEDURE Ask your health care provider about changing or stopping your regular medicines. This is especially important if you are taking diabetes medicines or blood thinners. Do not eat or drink anything after midnight on the night before the procedure or as directed by your health care provider. Ask your health care provider if you can take a sip of water with any approved medicines.   PROCEDURE  An IV tube will be put into one of your veins. You may be given medicine to help you relax before the surgery (sedative). You will be given one of the following: A medicine that numbs the area (local anesthetic). A medicine that makes you fall asleep (general anesthetic). Your health care provider will use a kind of electric scalpel that uses heat to minimize bleeding (electrocautery knife). A curved incision (like a smile or frown) that follows the natural curve of your breast is made, to allow for minimal scarring and better healing. The tumor will be removed with some of the surrounding tissue. This will be sent to the lab for analysis. Your health care provider may also remove your lymph nodes at this time if needed. Sometimes, but not always, a rubber tube called a drain will be surgically inserted into your breast area or armpit to collect excess fluid that may accumulate in the space where the tumor was. This drain is connected to a plastic bulb on the outside of your body. This drain creates suction to help remove the fluid. The incisions will be closed with stitches (sutures). A bandage may be placed over the incisions. AFTER  THE PROCEDURE You will be taken to the recovery area. You will be given medicine for pain. A small rubber drain may be placed in the breast for 2-3 days to prevent a collection of blood (hematoma) from developing in the breast. You will be given instructions on caring for the drain before you go home. A pressure bandage (dressing) will be  applied for 1-2 days to prevent bleeding. Ask your health care provider how to care for your bandage at home.   This information is not intended to replace advice given to you by your health care provider. Make sure you discuss any questions you have with your health care provider.   Document Released: 10/10/2006 Document Revised: 09/19/2014 Document Reviewed: 02/01/2013 Elsevier Interactive Patient Education [employer]

## 2023-10-09 ENCOUNTER — Telehealth: Payer: Self-pay | Admitting: Surgery

## 2023-10-09 NOTE — Telephone Encounter (Signed)
Patient has been advised of Pre-Admission date/time, and Surgery date at [facility].  Surgery Date: 10/16/23 Preadmission Testing Date: 10/10/23 (phone 8a-1p)  Patient has been made aware to call [phone], between 1-3:00pm the day before surgery, to find out what time to arrive for surgery.    Patient also reminded of breast tag placement at [facility] 10/11/23.

## 2023-10-10 ENCOUNTER — Other Ambulatory Visit: Payer: Self-pay

## 2023-10-10 ENCOUNTER — Encounter
Admission: RE | Admit: 2023-10-10 | Discharge: 2023-10-10 | Disposition: A | Discharge status: Home / Self Care | Source: Ambulatory Visit | Attending: Surgery | Admitting: Surgery

## 2023-10-10 VITALS — Ht 64.0 in | Wt 165.8 lb

## 2023-10-10 DIAGNOSIS — Z01812 Encounter for preprocedural laboratory examination: Secondary | ICD-10-CM

## 2023-10-10 HISTORY — DX: Unspecified benign mammary dysplasia of right breast: N60.91

## 2023-10-10 NOTE — Patient Instructions (Addendum)
Your procedure is scheduled on:   Monday , February 3  Report to the Registration Desk on the 1st floor of the [facility] To find out your arrival time, please call [phone] between 1PM - 3PM on:  Friday , January 31 If your arrival time is 6:00 am, do not arrive before that time as the [facility] entrance doors do not open until 6:00 am.  REMEMBER: Instructions that are not followed completely may result in serious medical risk, up to and including death; or upon the discretion of your surgeon and anesthesiologist your surgery may need to be rescheduled.  Do not eat food after midnight the night before surgery.  No gum chewing or hard candies.   One week prior to surgery:  Monday January 27  Stop Anti-inflammatories (NSAIDS) such as Advil, Aleve, Ibuprofen, Motrin, Naproxen, Naprosyn and Aspirin based products such as Excedrin, Goody's Powder, BC Powder. Stop ANY OVER THE COUNTER supplements until after surgery. Ascorbic Acid (VITAMIN C PO)  Cholecalciferol (VITAMIN D-1000 MAX ST)  Cyanocobalamin (VITAMIN B-12 PO)  Multiple Vitamin (MULTI-VITAMIN)  You may however, continue to take Tylenol if needed for pain up until the day of surgery.  Continue taking all of your other prescription medications up until the day of surgery.  ON THE DAY OF SURGERY DO NOT TAKE ANY MEDICATIONS.  No Alcohol for 24 hours before or after surgery.  No Smoking including e-cigarettes for 24 hours before surgery.  No chewable tobacco products for at least 6 hours before surgery.  No nicotine patches on the day of surgery.  Do not use any "recreational" drugs for at least a week (preferably 2 weeks) before your surgery.  Please be advised that the combination of cocaine and anesthesia may have negative outcomes, up to and including death. If you test positive for cocaine, your surgery will be cancelled.  On the morning of surgery brush your teeth with toothpaste and water, you may rinse your mouth  with mouthwash if you wish. Do not swallow any toothpaste or mouthwash.  Use CHG Soap as directed on instruction sheet.  Do not wear jewelry, make-up, hairpins, clips or nail polish.  For welded (permanent) jewelry: bracelets, anklets, waist bands, etc.  Please have this removed prior to surgery.  If it is not removed, there is a chance that hospital personnel will need to cut it off on the day of surgery.  Do not wear lotions, powders, or perfumes.   Do not shave body hair from the neck down 48 hours before surgery.  Contact lenses, hearing aids and dentures may not be worn into surgery.  Do not bring valuables to the hospital. [facility] is not responsible for any missing/lost belongings or valuables.   Notify your doctor if there is any change in your medical condition (cold, fever, infection).  Wear comfortable clothing (specific to your surgery type) to the hospital.  After surgery, you can help prevent lung complications by doing breathing exercises.  Take deep breaths and cough every 1-2 hours.    If you are being discharged the day of surgery, you will not be allowed to drive home. You will need a responsible individual to drive you home and stay with you for 24 hours after surgery.   If you are taking public transportation, you will need to have a responsible individual with you.  Please call the Pre-admissions Testing Dept. at [phone] if you have any questions about these instructions.  Surgery Visitation Policy:  Patients  having surgery or a procedure may have two visitors.  Children under the age of 11 must have an adult with them who is not the patient.  Temporary Visitor Restrictions Due to increasing cases of flu, RSV and COVID-19: Children ages 64 and under will not be able to visit patients in [facility] hospitals under most circumstances.           Preparing for Surgery with CHLORHEXIDINE GLUCONATE (CHG) Soap  Chlorhexidine Gluconate  (CHG) Soap  o An antiseptic cleaner that kills germs and bonds with the skin to continue killing germs even after washing  o Used for showering the night before surgery and morning of surgery  Before surgery, you can play an important role by reducing the number of germs on your skin.  CHG (Chlorhexidine gluconate) soap is an antiseptic cleanser which kills germs and bonds with the skin to continue killing germs even after washing.  Please do not use if you have an allergy to CHG or antibacterial soaps. If your skin becomes reddened/irritated stop using the CHG.  1. Shower the NIGHT BEFORE SURGERY and the MORNING OF SURGERY with CHG soap.  2. If you choose to wash your hair, wash your hair first as usual with your normal shampoo.  3. After shampooing, rinse your hair and body thoroughly to remove the shampoo.  4. Use CHG as you would any other liquid soap. You can apply CHG directly to the skin and wash gently with a scrungie or a clean washcloth.  5. Apply the CHG soap to your body only from the neck down. Do not use on open wounds or open sores. Avoid contact with your eyes, ears, mouth, and genitals (private parts). Wash face and genitals (private parts) with your normal soap.  6. Wash thoroughly, paying special attention to the area where your surgery will be performed.  7. Thoroughly rinse your body with warm water.  8. Do not shower/wash with your normal soap after using and rinsing off the CHG soap.  9. Pat yourself dry with a clean towel.  10. Wear clean pajamas to bed the night before surgery.  11. Place clean sheets on your bed the night of your first shower and do not sleep with pets.  12. Shower again with the CHG soap on the day of surgery prior to arriving at the hospital.  13. Do not apply any deodorants/lotions/powders.  14. Please wear clean clothes to the hospital.

## 2023-10-11 ENCOUNTER — Ambulatory Visit
Admission: RE | Admit: 2023-10-11 | Discharge: 2023-10-11 | Disposition: A | Discharge status: Home / Self Care | Source: Ambulatory Visit | Attending: Surgery | Admitting: Surgery

## 2023-10-11 DIAGNOSIS — N6489 Other specified disorders of breast: Secondary | ICD-10-CM | POA: Insufficient documentation

## 2023-10-11 HISTORY — PX: BREAST BIOPSY: SHX20

## 2023-10-11 MED ORDER — LIDOCAINE HCL 1 % IJ SOLN
10.0000 mL | Freq: Once | INTRAMUSCULAR | Status: AC
  Administered 2023-10-11: 10 mL
  Filled 2023-10-11: qty 10

## 2023-10-14 HISTORY — PX: BREAST EXCISIONAL BIOPSY: SUR124

## 2023-10-15 MED ORDER — CELECOXIB 200 MG PO CAPS
200.0000 mg | ORAL_CAPSULE | ORAL | Status: AC
  Administered 2023-10-16: 200 mg via ORAL

## 2023-10-15 MED ORDER — CHLORHEXIDINE GLUCONATE CLOTH 2 % EX PADS
6.0000 | MEDICATED_PAD | Freq: Once | CUTANEOUS | Status: AC
  Administered 2023-10-16: 6 via TOPICAL

## 2023-10-15 MED ORDER — CEFAZOLIN SODIUM-DEXTROSE 2-4 GM/100ML-% IV SOLN
2.0000 g | INTRAVENOUS | Status: AC
  Administered 2023-10-16: 2 g via INTRAVENOUS

## 2023-10-15 MED ORDER — BUPIVACAINE LIPOSOME 1.3 % IJ SUSP: 20.0000 mL | Freq: Once | INTRAMUSCULAR | Status: DC

## 2023-10-15 MED ORDER — LACTATED RINGERS IV SOLN: INTRAVENOUS | Status: DC

## 2023-10-15 MED ORDER — ORAL CARE MOUTH RINSE: 15.0000 mL | Freq: Once | OROMUCOSAL | Status: AC

## 2023-10-15 MED ORDER — GABAPENTIN 300 MG PO CAPS
300.0000 mg | ORAL_CAPSULE | ORAL | Status: AC
  Administered 2023-10-16: 300 mg via ORAL

## 2023-10-15 MED ORDER — CHLORHEXIDINE GLUCONATE 0.12 % MT SOLN
15.0000 mL | Freq: Once | OROMUCOSAL | Status: AC
  Administered 2023-10-16: 15 mL via OROMUCOSAL

## 2023-10-15 MED ORDER — CHLORHEXIDINE GLUCONATE CLOTH 2 % EX PADS
6.0000 | MEDICATED_PAD | Freq: Once | CUTANEOUS | Status: AC
  Administered 2023-10-15: 6 via TOPICAL

## 2023-10-15 MED ORDER — ACETAMINOPHEN 500 MG PO TABS
1000.0000 mg | ORAL_TABLET | ORAL | Status: AC
  Administered 2023-10-16: 1000 mg via ORAL

## 2023-10-16 ENCOUNTER — Ambulatory Visit
Admission: RE | Admit: 2023-10-16 | Discharge: 2023-10-16 | Disposition: A | Discharge status: Home / Self Care | Source: Ambulatory Visit | Attending: Surgery | Admitting: Surgery

## 2023-10-16 ENCOUNTER — Ambulatory Visit
Admission: RE | Admit: 2023-10-16 | Discharge: 2023-10-16 | Disposition: A | Discharge status: Home / Self Care | Attending: Surgery | Admitting: Surgery

## 2023-10-16 ENCOUNTER — Other Ambulatory Visit: Payer: Self-pay

## 2023-10-16 ENCOUNTER — Encounter
Admission: RE | Disposition: A | Discharge status: Home / Self Care | Payer: Self-pay | Source: Home / Self Care | Attending: Surgery

## 2023-10-16 ENCOUNTER — Encounter: Payer: Self-pay | Admitting: Surgery

## 2023-10-16 ENCOUNTER — Ambulatory Visit: Admitting: Urgent Care

## 2023-10-16 DIAGNOSIS — Z79810 Long term (current) use of selective estrogen receptor modulators (SERMs): Secondary | ICD-10-CM | POA: Diagnosis not present

## 2023-10-16 DIAGNOSIS — N6489 Other specified disorders of breast: Secondary | ICD-10-CM | POA: Diagnosis present

## 2023-10-16 DIAGNOSIS — Z01812 Encounter for preprocedural laboratory examination: Secondary | ICD-10-CM

## 2023-10-16 HISTORY — PX: BREAST BIOPSY WITH RADIO FREQUENCY LOCALIZER: SHX6895

## 2023-10-16 LAB — POCT PREGNANCY, URINE: Preg Test, Ur: NEGATIVE

## 2023-10-16 SURGERY — BREAST BIOPSY WITH RADIO FREQUENCY LOCALIZER
Anesthesia: General | Laterality: Right

## 2023-10-16 MED ORDER — OXYCODONE HCL 5 MG PO TABS: 5.0000 mg | ORAL_TABLET | Freq: Once | ORAL | Status: DC | PRN

## 2023-10-16 MED ORDER — MIDAZOLAM HCL 2 MG/2ML IJ SOLN
INTRAMUSCULAR | Status: AC
  Filled 2023-10-16: qty 2

## 2023-10-16 MED ORDER — MIDAZOLAM HCL 2 MG/2ML IJ SOLN
INTRAMUSCULAR | Status: DC | PRN
  Administered 2023-10-16: 2 mg via INTRAVENOUS

## 2023-10-16 MED ORDER — LIDOCAINE HCL (CARDIAC) PF 100 MG/5ML IV SOSY
PREFILLED_SYRINGE | INTRAVENOUS | Status: DC | PRN
  Administered 2023-10-16: 60 mg via INTRAVENOUS

## 2023-10-16 MED ORDER — OXYCODONE HCL 5 MG/5ML PO SOLN: 5.0000 mg | Freq: Once | ORAL | Status: DC | PRN

## 2023-10-16 MED ORDER — HYDROCODONE-ACETAMINOPHEN 5-325 MG PO TABS: 1.0000 | ORAL_TABLET | Freq: Four times a day (QID) | ORAL | 0 refills | Status: DC | PRN

## 2023-10-16 MED ORDER — CHLORHEXIDINE GLUCONATE 0.12 % MT SOLN
OROMUCOSAL | Status: AC
  Filled 2023-10-16: qty 15

## 2023-10-16 MED ORDER — GABAPENTIN 300 MG PO CAPS
ORAL_CAPSULE | ORAL | Status: AC
  Filled 2023-10-16: qty 1

## 2023-10-16 MED ORDER — FENTANYL CITRATE (PF) 100 MCG/2ML IJ SOLN
INTRAMUSCULAR | Status: DC | PRN
  Administered 2023-10-16 (×2): 50 ug via INTRAVENOUS

## 2023-10-16 MED ORDER — STERILE WATER FOR IRRIGATION IR SOLN
Status: DC | PRN
  Administered 2023-10-16: 200 mL

## 2023-10-16 MED ORDER — ACETAMINOPHEN 10 MG/ML IV SOLN: 1000.0000 mg | Freq: Once | INTRAVENOUS | Status: DC | PRN

## 2023-10-16 MED ORDER — CEFAZOLIN SODIUM-DEXTROSE 2-4 GM/100ML-% IV SOLN
INTRAVENOUS | Status: AC
  Filled 2023-10-16: qty 100

## 2023-10-16 MED ORDER — BUPIVACAINE LIPOSOME 1.3 % IJ SUSP
INTRAMUSCULAR | Status: AC
  Filled 2023-10-16: qty 10

## 2023-10-16 MED ORDER — KETAMINE HCL 50 MG/5ML IJ SOSY
PREFILLED_SYRINGE | INTRAMUSCULAR | Status: AC
  Filled 2023-10-16: qty 5

## 2023-10-16 MED ORDER — DEXAMETHASONE SODIUM PHOSPHATE 10 MG/ML IJ SOLN
INTRAMUSCULAR | Status: DC | PRN
  Administered 2023-10-16: 4 mg via INTRAVENOUS

## 2023-10-16 MED ORDER — EPHEDRINE 5 MG/ML INJ
INTRAVENOUS | Status: AC
  Filled 2023-10-16: qty 5

## 2023-10-16 MED ORDER — DROPERIDOL 2.5 MG/ML IJ SOLN: 0.6250 mg | Freq: Once | INTRAMUSCULAR | Status: DC | PRN

## 2023-10-16 MED ORDER — BUPIVACAINE-EPINEPHRINE (PF) 0.25% -1:200000 IJ SOLN
INTRAMUSCULAR | Status: DC | PRN
  Administered 2023-10-16: 30 mL

## 2023-10-16 MED ORDER — PROPOFOL 10 MG/ML IV BOLUS
INTRAVENOUS | Status: AC
  Filled 2023-10-16: qty 40

## 2023-10-16 MED ORDER — PROPOFOL 1000 MG/100ML IV EMUL
INTRAVENOUS | Status: AC
  Filled 2023-10-16: qty 100

## 2023-10-16 MED ORDER — FENTANYL CITRATE (PF) 100 MCG/2ML IJ SOLN: 25.0000 ug | INTRAMUSCULAR | Status: DC | PRN

## 2023-10-16 MED ORDER — PROPOFOL 10 MG/ML IV BOLUS
INTRAVENOUS | Status: DC | PRN
  Administered 2023-10-16: 200 mg via INTRAVENOUS
  Administered 2023-10-16: 20 mg via INTRAVENOUS
  Administered 2023-10-16: 50 mg via INTRAVENOUS
  Administered 2023-10-16: 100 ug/kg/min via INTRAVENOUS

## 2023-10-16 MED ORDER — BUPIVACAINE LIPOSOME 1.3 % IJ SUSP
INTRAMUSCULAR | Status: DC | PRN
  Administered 2023-10-16: 10 mL

## 2023-10-16 MED ORDER — BUPIVACAINE-EPINEPHRINE (PF) 0.25% -1:200000 IJ SOLN
INTRAMUSCULAR | Status: AC
  Filled 2023-10-16: qty 30

## 2023-10-16 MED ORDER — ONDANSETRON HCL 4 MG/2ML IJ SOLN
INTRAMUSCULAR | Status: DC | PRN
  Administered 2023-10-16: 4 mg via INTRAVENOUS

## 2023-10-16 MED ORDER — CELECOXIB 200 MG PO CAPS
ORAL_CAPSULE | ORAL | Status: AC
  Filled 2023-10-16: qty 1

## 2023-10-16 MED ORDER — ACETAMINOPHEN 500 MG PO TABS
ORAL_TABLET | ORAL | Status: AC
  Filled 2023-10-16: qty 2

## 2023-10-16 MED ORDER — EPHEDRINE SULFATE-NACL 50-0.9 MG/10ML-% IV SOSY
PREFILLED_SYRINGE | INTRAVENOUS | Status: DC | PRN
  Administered 2023-10-16: 5 mg via INTRAVENOUS

## 2023-10-16 MED ORDER — FENTANYL CITRATE (PF) 100 MCG/2ML IJ SOLN
INTRAMUSCULAR | Status: AC
  Filled 2023-10-16: qty 2

## 2023-10-16 SURGICAL SUPPLY — 34 items
APPLIER CLIP 9.375 SM OPEN (CLIP)
BLADE SURG 15 STRL LF DISP TIS (BLADE) ×1 IMPLANT
CHLORAPREP W/TINT 26 (MISCELLANEOUS) ×1 IMPLANT
CLIP APPLIE 9.375 SM OPEN (CLIP) IMPLANT
CNTNR URN SCR LID CUP LEK RST (MISCELLANEOUS) IMPLANT
DERMABOND ADVANCED .7 DNX12 (GAUZE/BANDAGES/DRESSINGS) ×1 IMPLANT
DEVICE DUBIN SPECIMEN MAMMOGRA (MISCELLANEOUS) ×1 IMPLANT
DRAPE CHEST BREAST 77X106 FENE (MISCELLANEOUS) IMPLANT
DRAPE LAPAROTOMY TRNSV 106X77 (MISCELLANEOUS) ×1 IMPLANT
ELECT CAUTERY BLADE TIP 2.5 (TIP) ×2
ELECT REM PT RETURN 9FT ADLT (ELECTROSURGICAL) ×1
ELECTRODE CAUTERY BLDE TIP 2.5 (TIP) ×1 IMPLANT
ELECTRODE REM PT RTRN 9FT ADLT (ELECTROSURGICAL) ×1 IMPLANT
GAUZE 4X4 16PLY ~~LOC~~+RFID DBL (SPONGE) ×1 IMPLANT
GLOVE ORTHO TXT STRL SZ7.5 (GLOVE) ×1 IMPLANT
GOWN STRL REUS W/ TWL LRG LVL3 (GOWN DISPOSABLE) ×1 IMPLANT
GOWN STRL REUS W/ TWL XL LVL3 (GOWN DISPOSABLE) ×1 IMPLANT
KIT MARKER MARGIN INK (KITS) IMPLANT
KIT TURNOVER KIT A (KITS) ×1 IMPLANT
MANIFOLD NEPTUNE II (INSTRUMENTS) ×1 IMPLANT
NDL HYPO 22X1.5 SAFETY MO (MISCELLANEOUS) ×1 IMPLANT
NEEDLE HYPO 22X1.5 SAFETY MO (MISCELLANEOUS) ×2
PACK BASIN MINOR ARMC (MISCELLANEOUS) ×1 IMPLANT
PENCIL SMOKE EVACUATOR (MISCELLANEOUS) IMPLANT
SHEATH BREAST BIOPSY SKIN MKR (SHEATH) ×1 IMPLANT
SUT MNCRL 4-0 27 PS-2 XMFL (SUTURE) ×1
SUT SILK 2 0 SH (SUTURE) IMPLANT
SUT VIC AB 3-0 SH 27X BRD (SUTURE) ×1 IMPLANT
SUTURE MNCRL 4-0 27XMF (SUTURE) ×1 IMPLANT
SYR 20ML LL LF (SYRINGE) ×1 IMPLANT
TRAP FLUID SMOKE EVACUATOR (MISCELLANEOUS) ×1 IMPLANT
TRAP NEPTUNE SPECIMEN COLLECT (MISCELLANEOUS) ×1 IMPLANT
WATER STERILE IRR 1000ML POUR (IV SOLUTION) ×1 IMPLANT
WATER STERILE IRR 500ML POUR (IV SOLUTION) ×1 IMPLANT

## 2023-10-16 NOTE — Anesthesia Procedure Notes (Signed)
Procedure Name: LMA Insertion Date/Time: 10/16/2023 7:36 AM  Performed by: Darrell Jewel I, CRNAPre-anesthesia Checklist: Patient identified Patient Re-evaluated:Patient Re-evaluated prior to induction Oxygen Delivery Method: Circle system utilized Preoxygenation: Pre-oxygenation with 100% oxygen Induction Type: IV induction Ventilation: Mask ventilation without difficulty LMA: LMA inserted LMA Size: 4.0 Tube type: Oral Number of attempts: 1 Placement Confirmation: positive ETCO2 and breath sounds checked- equal and bilateral Tube secured with: Tape Dental Injury: Teeth and Oropharynx as per pre-operative assessment

## 2023-10-16 NOTE — Transfer of Care (Signed)
Immediate Anesthesia Transfer of Care Note  Patient: Penny Tapia  Procedure(s) Performed: BREAST BIOPSY WITH RADIO FREQUENCY LOCALIZER (Right)  Patient Location: PACU  Anesthesia Type:General  Level of Consciousness: awake, alert , and oriented  Airway & Oxygen Therapy: Patient Spontanous Breathing  Post-op Assessment: Report given to RN and Post -op Vital signs reviewed and stable  Post vital signs: stable  Last Vitals:  Vitals Value Taken Time  BP 112/55 10/16/23 0840  Temp    Pulse 93 10/16/23 0842  Resp 12 10/16/23 0842  SpO2 99 % 10/16/23 0842  Vitals shown include unfiled device data.  Last Pain:  Vitals:   10/16/23 0617  TempSrc: Temporal  PainSc: 0-No pain         Complications: No notable events documented.

## 2023-10-16 NOTE — Anesthesia Postprocedure Evaluation (Signed)
Anesthesia Post Note  Patient: Penny Tapia  Procedure(s) Performed: BREAST BIOPSY WITH RADIO FREQUENCY LOCALIZER (Right)  Patient location during evaluation: PACU Anesthesia Type: General Level of consciousness: awake and alert Pain management: pain level controlled Vital Signs Assessment: post-procedure vital signs reviewed and stable Respiratory status: spontaneous breathing, nonlabored ventilation, respiratory function stable and patient connected to nasal cannula oxygen Cardiovascular status: blood pressure returned to baseline and stable Postop Assessment: no apparent nausea or vomiting Anesthetic complications: no   No notable events documented.   Last Vitals:  Vitals:   10/16/23 0910 10/16/23 0921  BP: 110/60 127/76  Pulse: 81 78  Resp: 16 18  Temp:  36.7 C  SpO2: 100%     Last Pain:  Vitals:   10/16/23 0921  TempSrc: Temporal  PainSc: 0-No pain                 Corinda Gubler

## 2023-10-16 NOTE — Op Note (Signed)
Right breast excisional biopsy, [facility] tag directed.  Pre-operative Diagnosis: Complex sclerosing lesion right inferior breast    Post-operative Diagnosis: Same  Surgeon: Campbell Lerner, M.D., FACS  Anesthesia: General LMA  Procedure: Right breast excisional biopsy, [facility] tag directed.  Procedure Details  The patient was seen again in the Holding Room. The benefits, complications, treatment options, and expected outcomes were discussed with the patient. The risks of bleeding, infection, recurrence of symptoms, failure to resolve symptoms, hematoma, seroma, open wound, cosmetic deformity, and the need for further surgery were discussed.  The patient was taken to Operating Room, identified as Penny Tapia and the procedure verified.  A Time Out was held and the above information confirmed.  Prior to the induction of general anesthesia, antibiotic prophylaxis was administered. VTE prophylaxis was in place. The patient was positioned in the supine position. Appropriate anesthesia was then administered and tolerated well.  The [facility] probe confirmed the site of shortest distance and highest cadence and that site is marked for incision.   Attention was turned to the [facility] localization site where an incision was made. Dissection using the Scout to perform a lumpectomy with adequate margins was performed. This was done with sharp dissection with scissors. Hemostasis is controlled after the risk of damaging the tag is diminished, and the cavity packed.  The specimen was taken to the back table and painted to demarcate the 6 surfaces of potential margin.   I returned to the cavity to remove the packing, and additional hemostasis was confirmed with electrocautery.   Local anesthesia of 0.25% Marcaine with epinephrine is infiltrated into the cavity perimeter.  Once assuring that hemostasis was adequate and checked multiple times being irrigated with water the wound was closed with  interrupted 3-0 Vicryl followed by 4-0 subcuticular Monocryl sutures.   Dermabond is utilized to seal the incision.  . Exparel is injected after closure of the biopsy cavity is sealed with Dermabond.  10 mL total.  Findings: Faxitron imaging: Both markers within the specimen  Estimated Blood Loss: 10 mL         Drains: None         Specimens: Inferior right breast tissue       Complications: None         Condition: Stable    Campbell Lerner, M.D., [facility]  10/16/2023 ; 8:54 AM

## 2023-10-16 NOTE — Anesthesia Preprocedure Evaluation (Addendum)
Anesthesia Evaluation  Patient identified by MRN, date of birth, ID band Patient awake    Reviewed: Allergy & Precautions, H&P , NPO status , Patient's Chart, lab work & pertinent test results, reviewed documented beta blocker date and time   Airway Mallampati: II   Neck ROM: full    Dental  (+) Poor Dentition   Pulmonary neg pulmonary ROS   Pulmonary exam normal        Cardiovascular Exercise Tolerance: Good negative cardio ROS Normal cardiovascular exam Rhythm:regular Rate:Normal     Neuro/Psych negative neurological ROS  negative psych ROS   GI/Hepatic negative GI ROS, Neg liver ROS,,,  Endo/Other  negative endocrine ROS    Renal/GU negative Renal ROS  negative genitourinary   Musculoskeletal   Abdominal   Peds  Hematology  (+) Blood dyscrasia, anemia   Anesthesia Other Findings Past Medical History: 2000: Abnormal vaginal Pap smear No date: Anemia     Comment:  after 2nd pregnancy only No date: 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 Moderate to severe atypia,               peripheral margin involved. Excised 06/02/2020. Margins               free. No date: Radial scar of breast Past Surgical History: 03/06/2023: BREAST BIOPSY; Right     Comment:  [place] Bx, Ribbon Clip, path pending 03/06/2023: BREAST BIOPSY; Right     Comment:  [place] RT BREAST BX W LOC DEV 1ST LESION IMG BX SPEC [place]               GUIDE 03/06/2023 [facility] 09/17/2023: BREAST BIOPSY; Right     Comment:  MR guided Bx-COMPLEX SCLEROSING LESION 10/11/2023: BREAST BIOPSY; Right     Comment:  MM RT RADIO FREQUENCY TAG LOC MAMMO GUIDE 10/11/2023               [facility] 03/24/2023: BREAST BIOPSY WITH RADIO FREQUENCY LOCALIZER; Right     Comment:  Procedure: BREAST BIOPSY WITH  RADIO FREQUENCY LOCALIZER;              Surgeon: Campbell Lerner, MD;  Location: [facility] ORS;                Service: General;  Laterality: Right; 04/13/1999: Cervical cryotherapy 09/2010: DILATION AND CURETTAGE OF UTERUS No date: FRACTURE SURGERY     Comment:  arm surgery BMI    Body Mass Index: 28.46 kg/m     Reproductive/Obstetrics negative OB ROS                             Anesthesia Physical Anesthesia Plan  ASA: 2  Anesthesia Plan: General   Post-op Pain Management:    Induction:   PONV Risk Score and Plan: 4 or greater and TIVA  Airway Management Planned:   Additional Equipment:   Intra-op Plan:   Post-operative Plan:   Informed Consent: I have reviewed the patients History and Physical, chart, labs and discussed the procedure including the risks, benefits and alternatives for the proposed anesthesia with the patient or authorized representative who has indicated his/her understanding and acceptance.     Dental Advisory Given  Plan Discussed with: CRNA  Anesthesia Plan Comments:        Anesthesia  Quick Evaluation

## 2023-10-16 NOTE — Interval H&P Note (Signed)
History and Physical Interval Note:  10/16/2023 7:28 AM  Penny Tapia  has presented today for surgery, with the diagnosis of complex sclerosing of right breast.  The various methods of treatment have been discussed with the patient and family. After consideration of risks, benefits and other options for treatment, the patient has consented to  Procedure(s): BREAST BIOPSY WITH RADIO FREQUENCY LOCALIZER (Right) as a surgical intervention.  The patient's history has been reviewed, patient examined, no change in status, stable for surgery.  I have reviewed the patient's chart and labs.  Questions were answered to the patient's satisfaction.   The right breast is marked.   Campbell Lerner

## 2023-10-17 ENCOUNTER — Ambulatory Visit: Admitting: Oncology

## 2023-10-17 ENCOUNTER — Encounter: Payer: Self-pay | Admitting: Surgery

## 2023-10-17 LAB — SURGICAL PATHOLOGY

## 2023-10-30 NOTE — Progress Notes (Unsigned)
[facility] POST-OP OFFICE VISIT  10/31/2023  HPI: Penny Tapia is a 48 y.o. female had surgery on  October 16, 2023, now s/p Rt inferior breast scout tagged excisional biopsy.  Reports no new issues or concerns following the procedure.  Vital signs: BP 117/79   Pulse 64   Temp 98.1 F (36.7 C) (Oral)   Ht 5\' 4"  (1.626 m)   Wt 162 lb 3.2 oz (73.6 kg)   LMP 09/19/2023 (Exact Date)   SpO2 98%   BMI 27.84 kg/m    Physical Exam: Constitutional: Appears well.  Skin: Right inferior breast incision appears to be healing nicely.  Without significant hematoma or induration.  Pathology noted below.  REPORT OF SURGICAL PATHOLOGY   Accession #: [id number]  Patient Name: Penny Tapia, Penny Tapia  Visit # : [id number]   MRN: [id number]  Physician: Campbell Lerner  DOB/Age 08-Jul-1976 (Age: 54) Gender: F  Collected Date: 10/16/2023  Received Date: 10/16/2023   FINAL DIAGNOSIS        1. Breast, lumpectomy, right inferior :       - RESIDUAL COMPLEX SCLEROSING LESION.       - BIOPSY SITE CHANGE WITH CLIP.       - SCOUT TAG PRESENT.       - NEGATIVE FOR ATYPIA AND MALIGNANCY.     Assessment/Plan: This is a 48 y.o. female with MRI detected complex sclerosing lesion of the right breast, status post Scout guided excisional biopsy.  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 Smith Robert has been excellent at breast examination along with following up the imaging.  We discussed the possibility that MRI screening may be an overkill or lead [place] to additional unnecessary biopsying.  We discussed the balance of over screening, over biopsying versus the reassurance that she is having adequate surveillance for her increased risk.  I felt it excessive to continue my  clinical examinations at top of Dr. Assunta Gambles, and feel that she may follow-up with [place] as needed should any future intervention be required.  Again I will be glad to see this delightful couple as needed in the future should any imaging or other concerns arise.   Campbell Lerner M.D., FACS 10/31/2023, 12:19 PM

## 2023-10-31 ENCOUNTER — Encounter: Payer: Self-pay | Admitting: Surgery

## 2023-10-31 ENCOUNTER — Ambulatory Visit (INDEPENDENT_AMBULATORY_CARE_PROVIDER_SITE_OTHER): Admitting: Surgery

## 2023-10-31 VITALS — BP 117/79 | HR 64 | Temp 98.1°F | Ht 64.0 in | Wt 162.2 lb

## 2023-10-31 DIAGNOSIS — Z09 Encounter for follow-up examination after completed treatment for conditions other than malignant neoplasm: Secondary | ICD-10-CM

## 2023-10-31 DIAGNOSIS — N6489 Other specified disorders of breast: Secondary | ICD-10-CM

## 2023-10-31 NOTE — Patient Instructions (Signed)
 Breast Self-Awareness Breast self-awareness means being familiar with how your breasts look and feel. It involves checking your breasts regularly and telling your health care provider about any changes. Practicing breast self-awareness helps to maintain breast health. Sometimes, changes are not harmful (are benign). Other times, a change in your breasts can be a sign of a serious medical problem. Being familiar with the look and feel of your breasts can help you catch a breast problem while it is still small and can be treated. You should do breast self-exams even if you have breast implants. What you need: A mirror. A well-lit room. A pillow or other soft object. How to do a breast self-exam A breast self-exam is one way to learn what is normal for your breasts and whether your breasts are changing. To do a breast self-exam: Look for changes  Remove all the clothing above your waist. Stand in front of a mirror in a room with good lighting. Put your hands down at your sides. Compare your breasts in the mirror. Look for differences between them (asymmetry), such as: Differences in shape. Differences in size. Puckers, dips, and bumps in one breast and not the other. Look at each breast for changes in the skin, such as: Redness. Scaly areas. Skin thickening. Dimpling. Open sores (ulcers). Look for changes in your nipples, such as: Discharge. Bleeding. Dimpling. Redness. A nipple that looks pushed in (retracted), or that has changed position. Feel for changes Carefully feel your breasts for lumps and changes. It is best to do this self-exam while lying down. Follow these steps to feel each breast: Place a pillow under the shoulder of one side of your body. Place the arm of that side of your body behind your head. Feel the breast of that side of your body using the hand of the opposite arm. To do this: Start in the nipple area and use the pads of your three middle fingers to make -inch  (2 cm) overlapping circles. Use light, medium, and then firm pressure as you feel your breast, gently covering the entire breast area and armpit. Continue the overlapping circles, moving downward over the breast until you feel your ribs below your breast. Then, make circles with your fingers going upward until you reach your collarbone. Next, make circles by moving outward across your breast and into your armpit area. Squeeze the nipple. Check for discharge and lumps. Repeat steps 1-7 to check your other breast. Sit or stand in the tub or shower. With soapy water on your skin, feel each breast the same way you did when you were lying down. Write down what you find Writing down what you find can help you remember what to discuss with your health care provider. Write down: What is normal for each breast. Any changes that you find in each breast. These include: The kind of changes you find. Any pain or tenderness. Size and location of any lumps. Where you are in your menstrual cycle, if you are still getting your menstrual period (menstruating). General tips If you are breastfeeding, the best time to examine your breasts is after a feeding or after using a breast pump. If you menstruate, the best time to examine your breasts is 5-7 days after your menstrual period. Breasts are generally lumpier during menstrual periods, and it may be more difficult to notice changes. With time and practice, you will become more familiar with the differences in your breasts and more comfortable with the exam. Contact a health care  provider if: You see a change in the shape or size of your breasts or nipples. You see a change in the skin of your breast or nipples, such as a reddened or scaly area. You have unusual discharge from your nipples. You find a new lump or thick area. You have breast pain. You have any concerns about your breast health. Summary Breast self-awareness includes looking for physical  changes in your breasts and feeling for any changes within your breasts. Breast self-awareness should be done in front of a mirror in a well-lit room. If you menstruate, the best time to examine your breasts is 5-7 days after your menstrual period. Tell your health care provider about any changes you notice in your breasts. Changes include changes in size, changes on the skin, pain or tenderness, or unusual fluid from your nipples. This information is not intended to replace advice given to you by your health care provider. Make sure you discuss any questions you have with your health care provider. Document Revised: 02/03/2022 Document Reviewed: 07/01/2021 Elsevier Patient Education  2024 [employer].

## 2023-11-01 ENCOUNTER — Inpatient Hospital Stay: Attending: Oncology | Admitting: Oncology

## 2023-11-01 ENCOUNTER — Encounter: Payer: Self-pay | Admitting: Oncology

## 2023-11-01 ENCOUNTER — Inpatient Hospital Stay: Admitting: Oncology

## 2023-11-01 VITALS — BP 110/51 | HR 81 | Temp 96.5°F | Resp 18 | Wt 165.0 lb

## 2023-11-01 DIAGNOSIS — Z807 Family history of other malignant neoplasms of lymphoid, hematopoietic and related tissues: Secondary | ICD-10-CM | POA: Insufficient documentation

## 2023-11-01 DIAGNOSIS — Z808 Family history of malignant neoplasm of other organs or systems: Secondary | ICD-10-CM | POA: Insufficient documentation

## 2023-11-01 DIAGNOSIS — Z79899 Other long term (current) drug therapy: Secondary | ICD-10-CM | POA: Diagnosis not present

## 2023-11-01 DIAGNOSIS — Z801 Family history of malignant neoplasm of trachea, bronchus and lung: Secondary | ICD-10-CM | POA: Diagnosis not present

## 2023-11-01 DIAGNOSIS — N6091 Unspecified benign mammary dysplasia of right breast: Secondary | ICD-10-CM | POA: Diagnosis not present

## 2023-11-01 DIAGNOSIS — Z800 Family history of malignant neoplasm of digestive organs: Secondary | ICD-10-CM | POA: Diagnosis not present

## 2023-11-01 DIAGNOSIS — Z5181 Encounter for therapeutic drug level monitoring: Secondary | ICD-10-CM | POA: Diagnosis not present

## 2023-11-01 DIAGNOSIS — Z79810 Long term (current) use of selective estrogen receptor modulators (SERMs): Secondary | ICD-10-CM | POA: Insufficient documentation

## 2023-11-01 NOTE — Progress Notes (Signed)
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  This was followed by diagnostic mammogram and ultrasound which showed 1.4 x 0.4 x 1 cm group of microcalcifications at the 2 o'clock position 9 cm from the nipple.  No evidence of right axillary adenopathy.  This was biopsy and was consistent with complex sclerosing lesion.  Patient was seen by Dr. Claudine Mouton and underwent definitive lumpectomy which showed complex sclerosing lesion 15 mm with usual ductal hyperplasia and atypical lobular hyperplasia.  Extensive adenosis with calcifications and ALH.  Additional superior lateral margin also showed usual ductal hyperplasia apocrine metaplasia and sclerosing adenosis.  Negative for atypia and malignancy.    Patient is being followed by GYN and was being evaluated for abnormal uterine bleeding.  She had endometrial biopsy which was negative and therefore patient started taking tamoxifen 20 mg daily in September 2024.  She was having more fatigue and joint pains with this dosing and therefore takes tamoxifen 10 mg every other day.  Genetic testing negative.  She has been getting mammograms alternating with  MRI January 2024 showed indeterminant enhancement in the right breast spanning 1.4 cm which was biopsy-proven complex sclerosing lesion.  She underwent a lumpectomy for the same which again showed complex sclerosing lesion but no evidence of atypia or malignancy     Interval history-patient has been tolerating low-dose tamoxifen 10 mg every other day without any significant side effects.  She is still sore from her recent right lumpectomy.  Menstrual cycles are regular presently on tamoxifen  ECOG PS- 0 Pain scale- 0   Review of systems- Review of Systems  Constitutional:  Negative for chills, fever, malaise/fatigue and weight loss.  HENT:  Negative for congestion, ear discharge and nosebleeds.   Eyes:  Negative for blurred vision.  Respiratory:  Negative for cough, hemoptysis, sputum production, shortness of breath and wheezing.   Cardiovascular:  Negative for chest pain, palpitations, orthopnea and claudication.  Gastrointestinal:  Negative for abdominal pain, blood in stool, constipation, diarrhea, heartburn, melena, nausea and vomiting.  Genitourinary:  Negative for dysuria, flank pain, frequency, hematuria and urgency.  Musculoskeletal:  Negative for back pain, joint pain and myalgias.  Skin:  Negative for rash.  Neurological:  Negative for dizziness, tingling, focal weakness, seizures, weakness and headaches.  Endo/Heme/Allergies:  Does not bruise/bleed easily.  Psychiatric/Behavioral:  Negative for depression and suicidal ideas. The patient does not have insomnia.       No Known Allergies   Past Medical History:  Diagnosis Date   Abnormal vaginal Pap smear 2000   Anemia    after 2nd pregnancy only   Atypical lobular hyperplasia (ALH) of right breast    Complex sclerosing lesion of right breast    Herpes genitalis    Hx of dysplastic nevus 03/09/2020   R medial side of foot. Moderate to severe atypia, peripheral margin involved. Excised 06/02/2020. Margins free.   Radial scar of breast      Past Surgical History:  Procedure Laterality Date   BREAST BIOPSY  Right 03/06/2023   [place] Bx, Ribbon Clip, path pending   BREAST BIOPSY Right 03/06/2023   [place] RT BREAST BX W LOC DEV 1ST LESION IMG BX SPEC US GUIDE 03/06/2023 [facility]    BREAST BIOPSY Right 09/17/2023   MR guided Bx- COMPLEX SCLEROSING LESION   BREAST BIOPSY Right 10/11/2023   MM RT RADIO FREQUENCY TAG LOC MAMMO GUIDE 10/11/2023 [facility]   BREAST BIOPSY WITH RADIO FREQUENCY LOCALIZER Right 03/24/2023   Procedure: BREAST BIOPSY WITH RADIO FREQUENCY LOCALIZER;  Surgeon: Campbell Lerner, MD;  Location: [facility];  Service: General;  Laterality: Right;   BREAST BIOPSY WITH RADIO FREQUENCY LOCALIZER Right 10/16/2023   Procedure: BREAST BIOPSY WITH RADIO FREQUENCY LOCALIZER;  Surgeon: Campbell Lerner, MD;  Location: [facility];  Service: General;  Laterality: Right;   Cervical cryotherapy  04/13/1999   DILATION AND CURETTAGE OF UTERUS  09/2010   FRACTURE SURGERY     arm surgery    Social History   Socioeconomic History   Marital status: Married    Spouse name: Roland   Number of children: Not on file   Years of education: Not on file   Highest education level: Not on file  Occupational History   Not on file  Tobacco Use   Smoking status: Never    Passive exposure: Never   Smokeless tobacco: Never  Vaping Use   Vaping status: Never Used  Substance and Sexual Activity   Alcohol use: Yes    Comment: occ   Drug use: Never   Sexual activity: Yes    Birth control/protection: None, Surgical    Comment: Vasectomy  Other Topics Concern   Not on file  Social History Narrative   Not on file   Social Drivers of Health   Financial Resource Strain: Low Risk  (12/26/2022)   Received from [facility], [facility]   Overall Financial Resource Strain (CARDIA)    Difficulty of Paying Living Expenses: Not hard at all  Food Insecurity: No Food Insecurity (04/17/2023)   Hunger Vital Sign    Worried About Running Out of Food in the Last Year: Never true    Ran Out of Food in the Last Year: Never true  Transportation Needs: No Transportation Needs (04/17/2023)   PRAPARE - Administrator, Civil Service (Medical):  No    Lack of Transportation (Non-Medical): No  Physical Activity: Sufficiently Active (12/19/2019)   Received from [facility], [facility]   Exercise Vital Sign    Days of Exercise per Week: 6 days    Minutes of Exercise per Session: 40 min  Stress: No Stress Concern Present (12/19/2019)   Received from [facility], [facility]   [employer] of Occupational Health - Occupational Stress Questionnaire    Feeling of Stress : Only a little  Social Connections: Moderately Integrated (12/19/2019)   Received from [facility], [facility]   Social Connection and Isolation Panel [NHANES]    Frequency of Communication with Friends and Family: Three times a week    Frequency of Social Gatherings with Friends and Family: Never    Attends Religious Services: More than 4 times per year    Active Member of [employer] or Organizations: No    Attends Banker Meetings: Never    Marital Status: Married  Catering manager Violence: Not At Risk (04/17/2023)   Humiliation, Afraid, Rape, and Kick questionnaire  Fear of Current or Ex-Partner: No    Emotionally Abused: No    Physically Abused: No    Sexually Abused: No    Family History  Problem Relation Age of Onset   Skin cancer Mother        x3   Liver cancer Father 2   Lymphoma Paternal Uncle    Diabetes Maternal Grandmother    Uterine cancer Maternal Grandmother 28   Lung cancer Maternal Grandfather 27   Colon cancer Paternal Grandfather 5   Breast cancer Neg Hx      Current Outpatient Medications:    Ascorbic Acid (VITAMIN C PO), Take 1 tablet by mouth daily., Disp: , Rfl:    Cholecalciferol (VITAMIN D-1000 MAX ST) 25 MCG (1000 UT) tablet, Take 1,000 Units by mouth daily., Disp: , Rfl:    Cyanocobalamin (VITAMIN B-12 PO), Take 1 tablet by mouth daily., Disp: , Rfl:    fluticasone (FLONASE) 50 MCG/ACT nasal spray, Place 2  sprays into both nostrils daily as needed for allergies or rhinitis., Disp: , Rfl:    ketoconazole (NIZORAL) 2 % shampoo, apply topically x 3 times weekly to scalp and massage into scalp and leave in for 3 - 5 minutes before rinsing out. Can also use 3 x weekly as a face wash to aa of face (Patient taking differently: 1 Application 2 (two) times a week. apply topically x 3 times weekly to scalp and massage into scalp and leave in for 3 - 5 minutes before rinsing out. Can also use 3 x weekly as a face wash to aa of face), Disp: 120 mL, Rfl: 11   mometasone (ELOCON) 0.1 % lotion, Apply to affected skin 5 nights a week prn flares, Disp: 60 mL, Rfl: 6   Multiple Vitamin (MULTI-VITAMIN) tablet, Take 1 tablet by mouth daily., Disp: , Rfl:    tamoxifen (NOLVADEX) 10 MG tablet, Take 1 tablet (10 mg total) by mouth every other day., Disp: 30 tablet, Rfl: 5   valACYclovir (VALTREX) 500 MG tablet, Take 1 tablet (500 mg total) by mouth daily. Or BID for 3 days prn sx (Patient taking differently: Take 500 mg by mouth every evening.), Disp: 90 tablet, Rfl: 2  Physical exam:  Vitals:   11/01/23 0921  BP: (!) 110/51  Pulse: 81  Resp: 18  Temp: (!) 96.5 F (35.8 C)  TempSrc: Tympanic  SpO2: 98%  Weight: 165 lb (74.8 kg)   Physical Exam Cardiovascular:     Rate and Rhythm: Normal rate and regular rhythm.     Heart sounds: Normal heart sounds.  Pulmonary:     Effort: Pulmonary effort is normal.     Breath sounds: Normal breath sounds.  Skin:    General: Skin is warm and dry.  Neurological:     Mental Status: She is alert and oriented to person, place, and time.   Breast exam: 2 areas of lumpectomy scar seen over the right breast.  No palpable masses in either breast.  No palpable bilateral axillary adenopathy.     Latest Ref Rng & Units 03/24/2023    8:51 AM  CMP  Glucose 70 - 99 mg/dL 97   BUN 6 - 20 mg/dL 8   Creatinine 9.14 - 7.82 mg/dL 9.56   Sodium 213 - 086 mmol/L 138   Potassium 3.5 - 5.1  mmol/L 3.9   Chloride 98 - 111 mmol/L 108   CO2 22 - 32 mmol/L 25   Calcium 8.9 - 10.3 mg/dL 8.8  Total Protein 6.5 - 8.1 g/dL 6.4   Total Bilirubin 0.3 - 1.2 mg/dL 0.6   Alkaline Phos 38 - 126 U/L 37   AST 15 - 41 U/L 17   ALT 0 - 44 U/L 17       Latest Ref Rng & Units 03/24/2023    8:51 AM  CBC  WBC 4.0 - 10.5 K/uL 5.1   Hemoglobin 12.0 - 15.0 g/dL 96.0   Hematocrit 45.4 - 46.0 % 38.3   Platelets 150 - 400 K/uL 265     No images are attached to the encounter.  MM Breast Surgical Specimen Result Date: 10/16/2023 CLINICAL DATA:  Status post [facility] localized right breast lumpectomy for complex sclerosing lesion. EXAM: SPECIMEN RADIOGRAPH OF THE RIGHT BREAST COMPARISON:  Previous exam(s). FINDINGS: Status post excision of the right breast. The [facility] reflector and barbell shaped clip are present within the specimen. IMPRESSION: Specimen radiograph of the right breast. Electronically Signed   By: Jacob Moores M.D.   On: 10/16/2023 09:18   MM RT RADIO FREQUENCY TAG LOC MAMMO GUIDE Result Date: 10/11/2023 CLINICAL DATA:  SAVI scout localization of the RIGHT breast prior to excisional biopsy for biopsy-proven complex sclerosing lesion (detected on screening MRI as non-mass enhancement and demarcated by barbell clip). EXAM: NEEDLE LOCALIZATION OF THE RIGHT BREAST WITH MAMMO GUIDANCE COMPARISON:  Previous exam(s). FINDINGS: Patient presents for needle localization prior to excisional biopsy. I met with the patient and we discussed the procedure of needle localization including benefits and alternatives. We discussed the high likelihood of a successful procedure. We discussed the risks of the procedure, including infection, bleeding, tissue injury, and further surgery. Informed, written consent was given. The usual time-out protocol was performed immediately prior to the procedure. Using mammographic guidance, sterile technique, 1% lidocaine and a 10 cm SAVI SCOUT needle, the barbell clip  was localized using a medial approach. The images were marked for Dr. Claudine Mouton. IMPRESSION: Radar reflector localization of the right breast. No apparent complications. Electronically Signed   By: Jacob Moores M.D.   On: 10/11/2023 16:13     Assessment and plan- Patient is a 48 y.o. female with history of atypical lobular hyperplasia of the right breast presently on tamoxifen.  This is a routine follow-up visit.  Patient had MRI bilateral breast in December 2024 which showed possible calcifications in the right breast which was biopsy-proven complex sclerosing lesion she was seen by Dr. Claudine Mouton and underwent another lumpectomy for this which did not show any evidence of atypia or malignancy.  She is tolerating low-dose tamoxifen 10 mg every other day well without any significant side effects.  She could not tolerate the full dose 20 mg daily due to arthralgias.  I will schedule her mammogram in June 2025 and see her back in 6 months no labs   Visit Diagnosis 1. Atypical lobular hyperplasia (ALH) of right breast   2. Encounter for monitoring tamoxifen therapy   3. High risk medication use      Dr. Owens Shark, MD, MPH [facility] [phone] 11/01/2023 9:58 AM

## 2023-11-01 NOTE — Addendum Note (Signed)
Addended by: Keitha Butte on: 11/01/2023 10:23 AM   Modules accepted: Orders

## 2023-11-27 ENCOUNTER — Other Ambulatory Visit: Payer: Self-pay | Admitting: Oncology

## 2023-11-27 ENCOUNTER — Other Ambulatory Visit: Payer: Self-pay | Admitting: *Deleted

## 2023-11-27 DIAGNOSIS — Z1231 Encounter for screening mammogram for malignant neoplasm of breast: Secondary | ICD-10-CM

## 2023-11-27 DIAGNOSIS — N6091 Unspecified benign mammary dysplasia of right breast: Secondary | ICD-10-CM

## 2023-11-28 IMAGING — MG MM DIGITAL SCREENING BILAT W/ TOMO AND CAD
8 series · 9 of 24 positions shown · non-contrast
Comparison: Previous exam(s).

CLINICAL DATA: Screening.

EXAM:
DIGITAL SCREENING BILATERAL MAMMOGRAM WITH TOMOSYNTHESIS AND CAD
TECHNIQUE: Bilateral screening digital craniocaudal and mediolateral oblique
mammograms were obtained. Bilateral screening digital breast
tomosynthesis was performed. The images were evaluated with
computer-aided detection.

[L CC synth-2D]
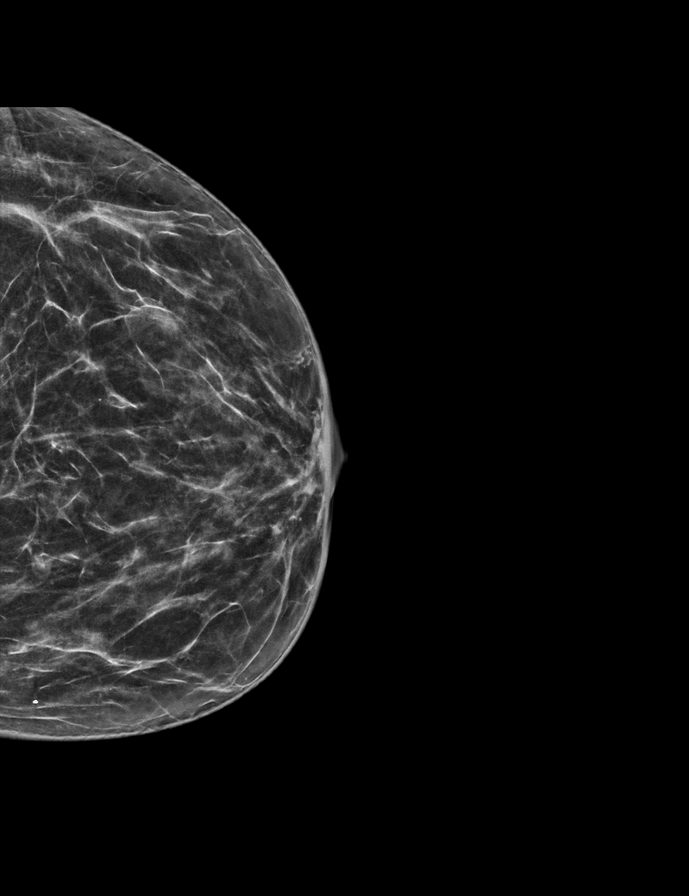

[R CC synth-2D]
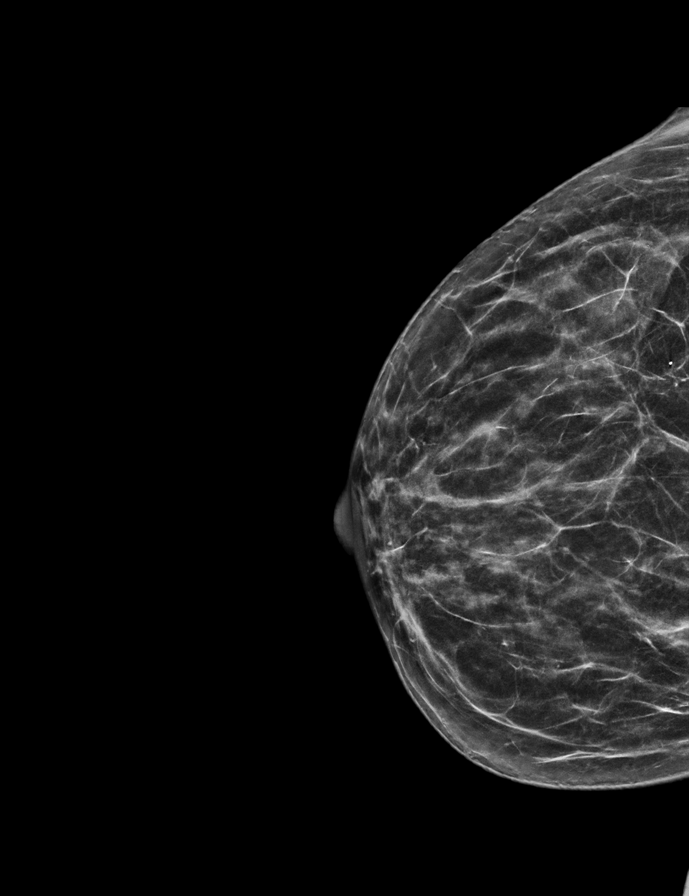

[L MLO synth-2D]
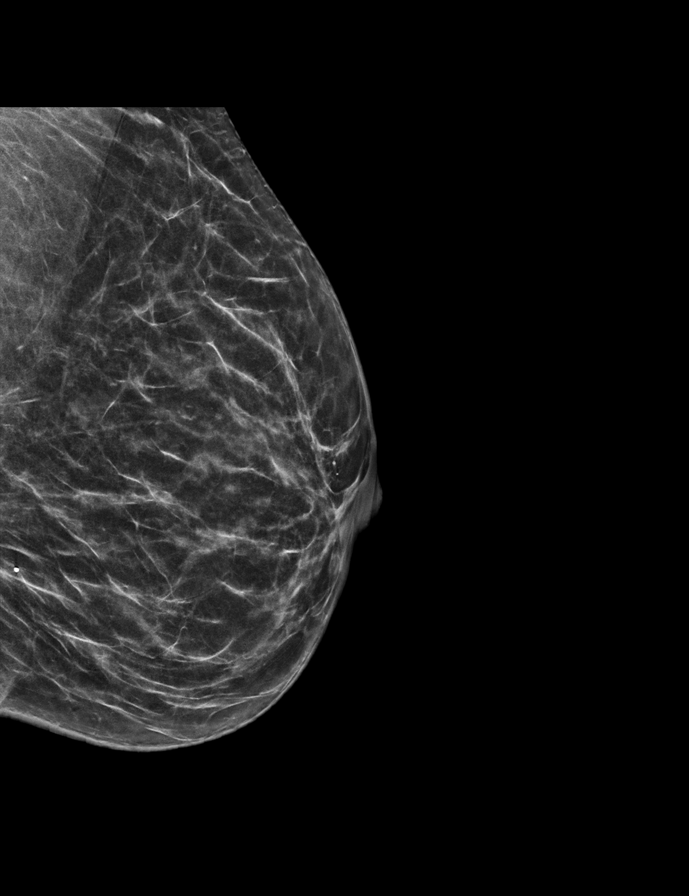

[R MLO synth-2D]
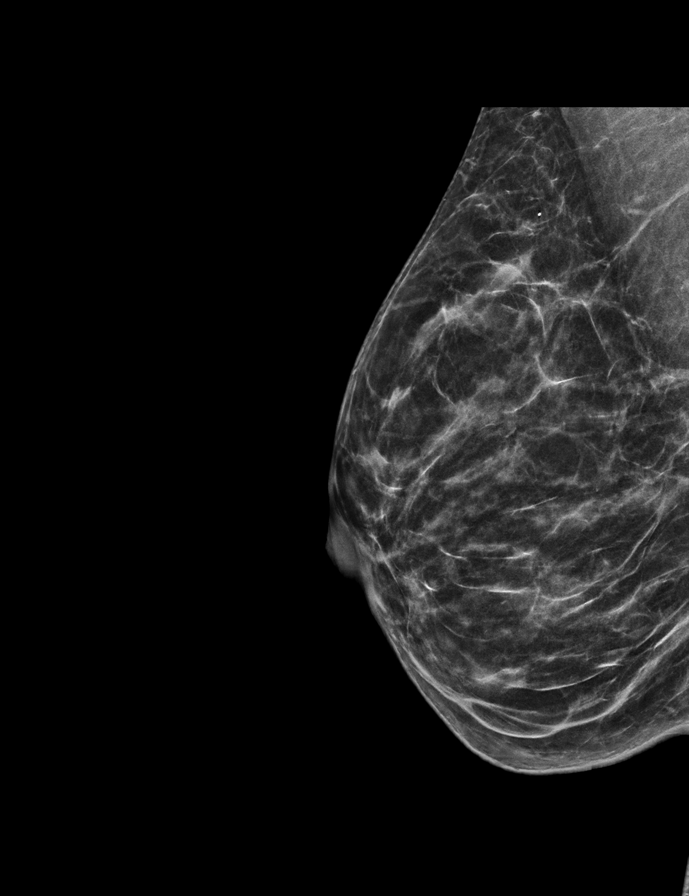

[L CC tomo · 2 of 53 frames shown]
[frame 18/53]
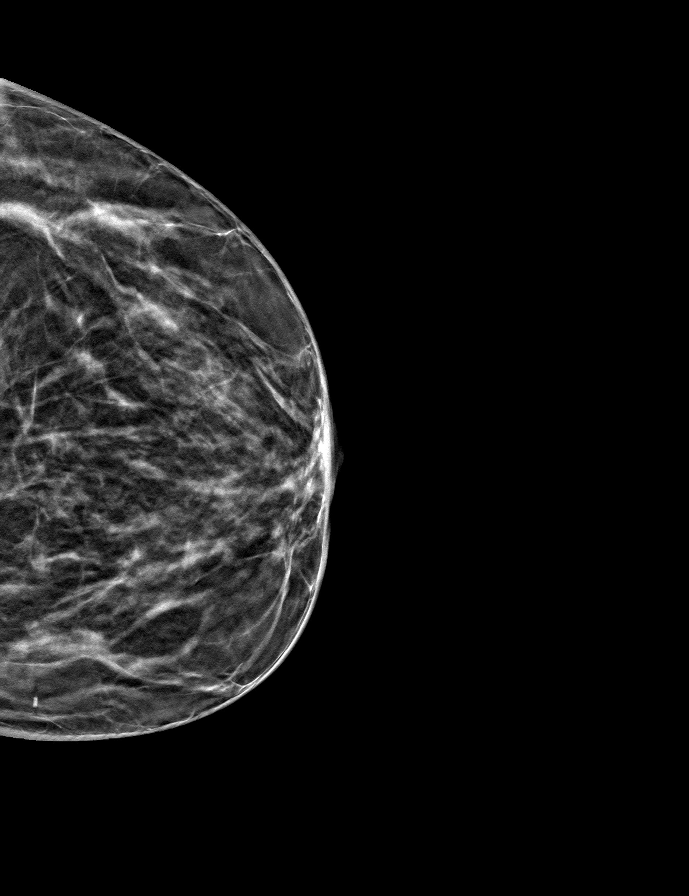
[frame 27/53]
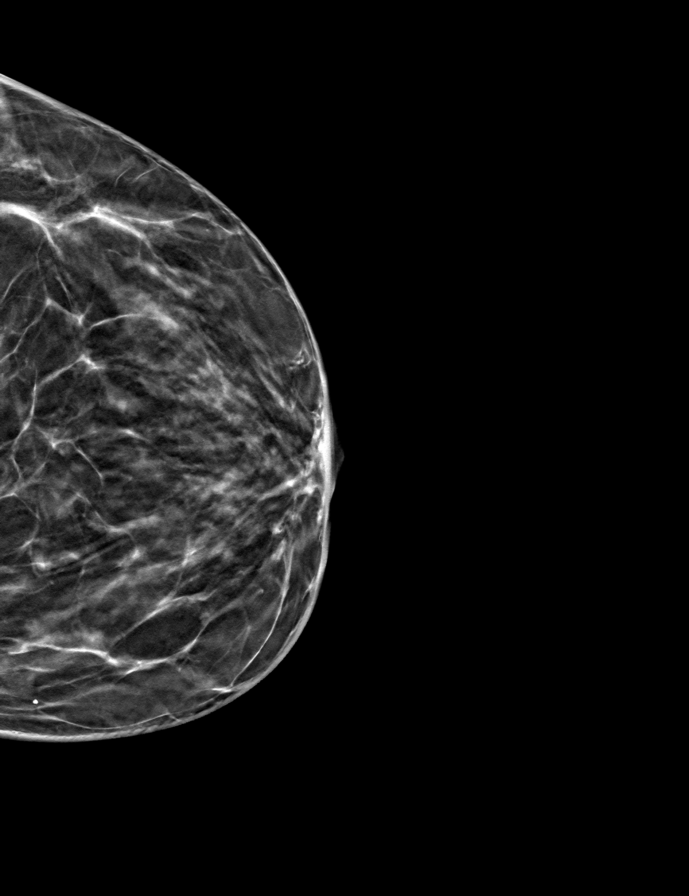

[R CC tomo · tomo slice 27/52.0]
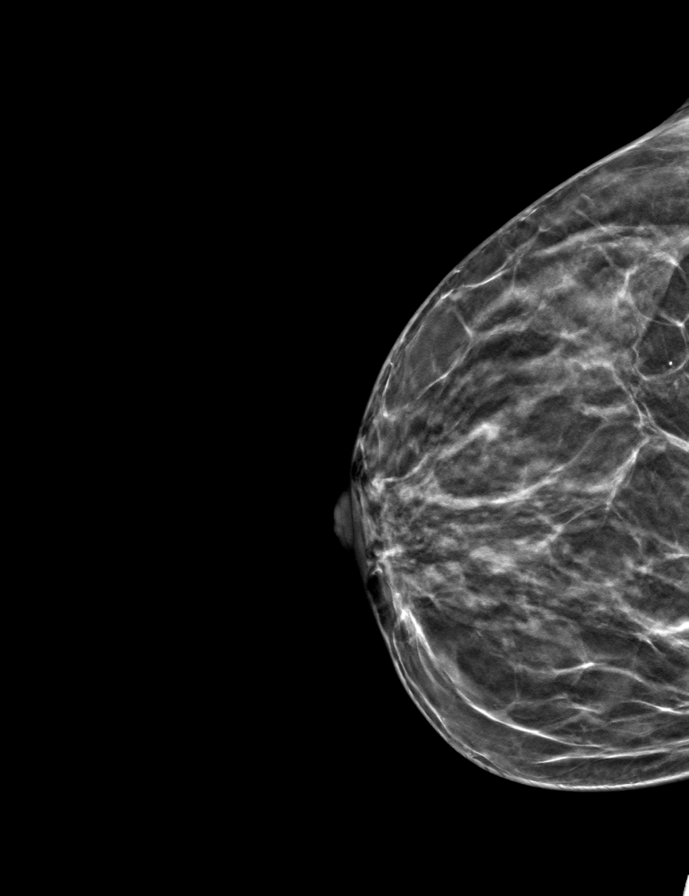

[L MLO tomo · tomo slice 27/53.0]
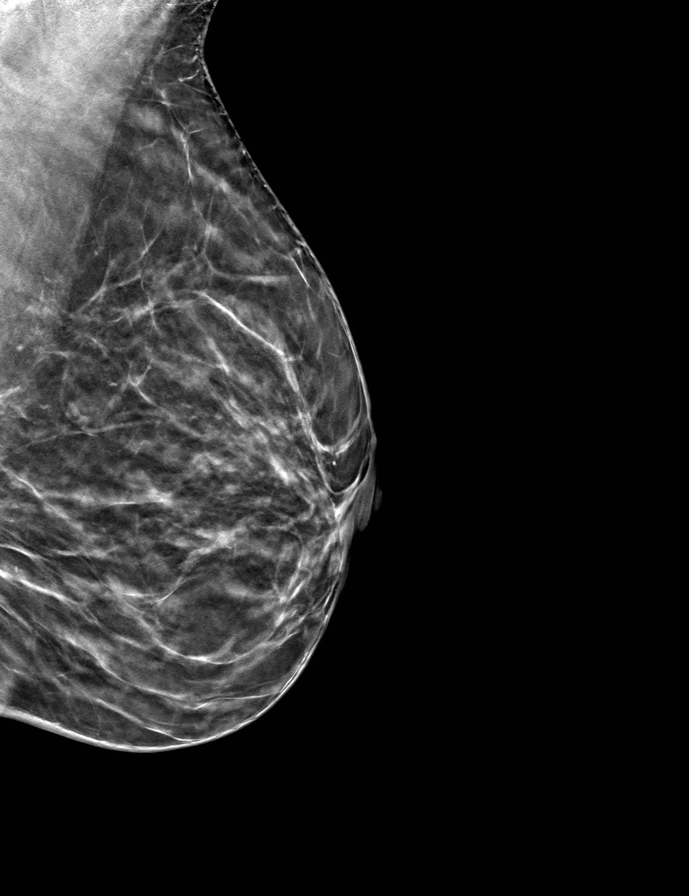

[R MLO tomo · tomo slice 27/52.0]
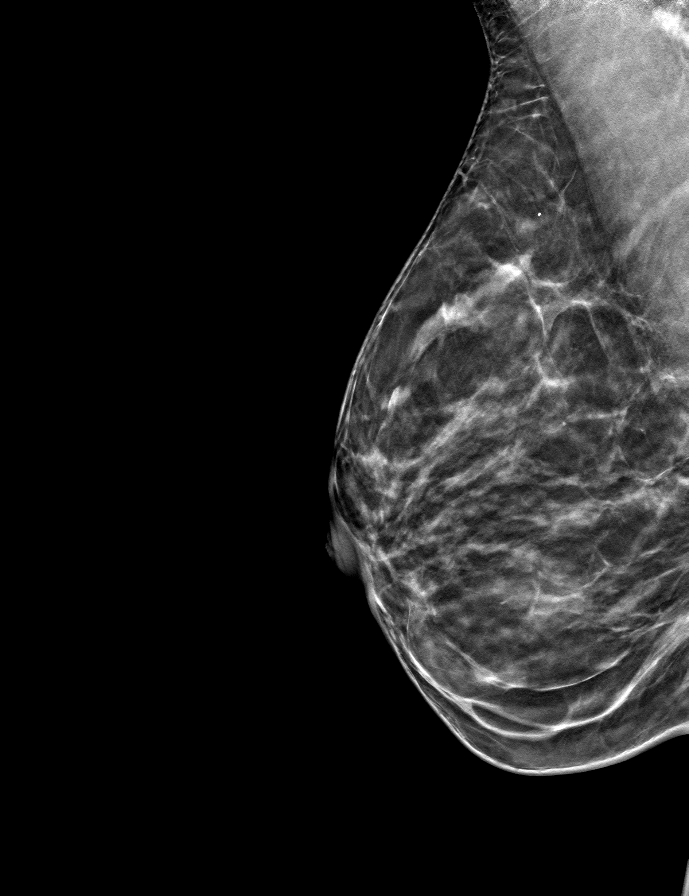

[9 of 24 positions shown; findings below may reference images not displayed]

ACR Breast Density Category b: There are scattered areas of
fibroglandular density.
FINDINGS: There are no findings suspicious for malignancy.
IMPRESSION: No mammographic evidence of malignancy. A result letter of this
screening mammogram will be mailed directly to the patient.

RECOMMENDATION:
Screening mammogram in one year. (Code:51-O-LD2)

BI-RADS CATEGORY  1: Negative.

## 2024-02-01 NOTE — Progress Notes (Unsigned)
 PCP:  Lyle San, MD   No chief complaint on file.   HPI:      [name] is a 48 y.o. No obstetric history on file. who LMP was No LMP recorded., presents today for her annual examination.  Her menses are regular every 28-30 days, lasting 5-7 days, mod to heavy flow (changing products hourly, getting heavier).  Dysmenorrhea mild, occurring first 1-2 days of flow. Has had spotting a week before her period the past few cycles. Started to have VS sx. 6/24 GYN u/s EM=14 mm, possible endometrial polyp   Sex activity: single partner, contraception - vasectomy. No pain/bleeding; has started to have some vaginal dryness, improved with lubricants.  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 Randy Buttery now. Has mammo appt 02/20/24 There is no FH of breast cancer. There is no FH of ovarian cancer. The patient does not do self-breast exams.  Tobacco use: The patient denies current or previous tobacco use. Alcohol use: social drinker No drug use.  Exercise: moderately active  She does get adequate calcium and Vitamin D in her diet. 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 Moderate to severe atypia, peripheral margin involved. Excised 06/02/2020. Margins free.   Radial scar of breast     Past Surgical History:  Procedure Laterality Date   BREAST BIOPSY Right 03/06/2023   US  Bx, Ribbon Clip, path pending   BREAST BIOPSY Right 03/06/2023   US  RT BREAST BX W LOC DEV 1ST LESION IMG BX SPEC US  GUIDE  03/06/2023 [facility]   BREAST BIOPSY Right 09/17/2023   MR guided Bx- COMPLEX SCLEROSING LESION   BREAST BIOPSY Right 10/11/2023   MM RT RADIO FREQUENCY TAG LOC MAMMO GUIDE 10/11/2023 [facility]   BREAST BIOPSY WITH RADIO FREQUENCY LOCALIZER Right 03/24/2023   Procedure: BREAST BIOPSY WITH RADIO FREQUENCY LOCALIZER;  Surgeon: Flynn Hylan, MD;  Location: [facility];  Service: General;  Laterality: Right;   BREAST BIOPSY WITH RADIO FREQUENCY LOCALIZER Right 10/16/2023   Procedure: BREAST BIOPSY WITH RADIO FREQUENCY LOCALIZER;  Surgeon: Flynn Hylan, MD;  Location: [facility];  Service: General;  Laterality: Right;   Cervical cryotherapy  04/13/1999   DILATION AND CURETTAGE OF UTERUS  09/2010   FRACTURE SURGERY     arm surgery    Family History  Problem Relation Age of Onset   Skin cancer Mother        x3   Liver cancer Father 64   Lymphoma Paternal Uncle    Diabetes Maternal Grandmother    Uterine cancer Maternal Grandmother 28   Lung cancer Maternal Grandfather 29   Colon cancer Paternal Grandfather 41   Breast cancer Neg Hx     Social History   Socioeconomic History   Marital status: Married    Spouse name: Roland   Number of children: Not on file   Years of education: Not on file   Highest education level:  Not on file  Occupational History   Not on file  Tobacco Use   Smoking status: Never    Passive exposure: Never   Smokeless tobacco: Never  Vaping Use   Vaping status: Never Used  Substance and Sexual Activity   Alcohol use: Yes    Comment: occ   Drug use: Never   Sexual activity: Yes    Birth control/protection: None, Surgical    Comment: Vasectomy  Other Topics Concern   Not on file  Social History Narrative   Not on file   Social Drivers of Health   Financial Resource Strain: Low Risk  (12/28/2023)   Received from [facility]   Overall Financial Resource Strain (CARDIA)    Difficulty of Paying Living Expenses: Not hard  at all  Food Insecurity: No Food Insecurity (12/28/2023)   [facility]   Hunger Vital Sign    Worried About Running Out of Food in the Last Year: Never true    Ran Out of Food in the Last Year: Never true  Transportation Needs: No Transportation Needs (12/28/2023)   Received from [facility] - Transportation    In the past 12 months, has lack of transportation kept you from medical appointments or from getting medications?: No    Lack of Transportation (Non-Medical): No  Physical Activity: Sufficiently Active (12/19/2019)   Received from [facility], [facility]   Exercise Vital Sign    Days of Exercise per Week: 6 days    Minutes of Exercise per Session: 40 min  Stress: No Stress Concern Present (12/19/2019)   Received from [facility], [facility]   [employer] of Occupational Health - Occupational Stress Questionnaire    Feeling of Stress : Only a little  Social Connections: Moderately Integrated (12/19/2019)   Received from [facility], [facility]   Social Connection and Isolation Panel [NHANES]    Frequency of Communication with Friends and Family: Three times a week    Frequency of Social Gatherings with Friends and Family: Never    Attends Religious Services: More than 4 times per year    Active Member of [employer] or Organizations: No    Attends Banker Meetings: Never    Marital Status: Married  Catering manager Violence: Not At Risk (04/17/2023)   Humiliation, Afraid, Rape, and Kick questionnaire    Fear of Current or Ex-Partner: No    Emotionally Abused: No    Physically Abused: No    Sexually Abused: No     Current Outpatient Medications:    Ascorbic Acid (VITAMIN C PO), Take 1 tablet by mouth daily., Disp: , Rfl:    Cholecalciferol (VITAMIN D-1000 MAX ST) 25 MCG (1000 UT) tablet, Take 1,000  Units by mouth daily., Disp: , Rfl:    Cyanocobalamin (VITAMIN B-12 PO), Take 1 tablet by mouth daily., Disp: , Rfl:    fluticasone (FLONASE) 50 MCG/ACT nasal spray, Place 2 sprays into both nostrils daily as needed for allergies or rhinitis., Disp: , Rfl:    ketoconazole  (NIZORAL ) 2 % shampoo, apply topically x 3 times weekly to scalp and massage into scalp and leave in for 3 - 5 minutes before rinsing out. Can also use 3 x weekly as a face wash to aa of face (Patient taking differently: 1 Application 2 (two) times a week. apply topically x 3 times weekly to  scalp and massage into scalp and leave in for 3 - 5 minutes before rinsing out. Can also use 3 x weekly as a face wash to aa of face), Disp: 120 mL, Rfl: 11   mometasone  (ELOCON ) 0.1 % lotion, Apply to affected skin 5 nights a week prn flares, Disp: 60 mL, Rfl: 6   Multiple Vitamin (MULTI-VITAMIN) tablet, Take 1 tablet by mouth daily., Disp: , Rfl:    tamoxifen  (NOLVADEX ) 10 MG tablet, Take 1 tablet (10 mg total) by mouth every other day., Disp: 30 tablet, Rfl: 5   valACYclovir  (VALTREX ) 500 MG tablet, Take 1 tablet (500 mg total) by mouth daily. Or BID for 3 days prn sx (Patient taking differently: Take 500 mg by mouth every evening.), Disp: 90 tablet, Rfl: 2     ROS:  Review of Systems  Constitutional:  Negative for fatigue, fever and unexpected weight change.  Respiratory:  Negative for cough, shortness of breath and wheezing.   Cardiovascular:  Negative for chest pain, palpitations and leg swelling.  Gastrointestinal:  Negative for blood in stool, constipation, diarrhea, nausea and vomiting.  Endocrine: Negative for cold intolerance, heat intolerance and polyuria.  Genitourinary:  Positive for genital sores and menstrual problem. Negative for dyspareunia, dysuria, flank pain, frequency, hematuria, pelvic pain, urgency, vaginal bleeding, vaginal discharge and vaginal pain.  Musculoskeletal:  Negative for back pain, joint swelling and  myalgias.  Skin:  Negative for rash.  Neurological:  Negative for dizziness, syncope, light-headedness, numbness and headaches.  Hematological:  Negative for adenopathy.  Psychiatric/Behavioral:  Negative for agitation, confusion, sleep disturbance and suicidal ideas. The patient is not nervous/anxious.   BREAST: No symptoms   Objective: There were no vitals taken for this visit.   Physical Exam Constitutional:      Appearance: She is well-developed.  Genitourinary:     Vulva normal.     Right Labia: No rash, tenderness or lesions.    Left Labia: No tenderness, lesions or rash.    Vaginal bleeding present.     No vaginal discharge, erythema or tenderness.      Right Adnexa: not tender and no mass present.    Left Adnexa: not tender and no mass present.    No cervical friability or polyp.     Uterus is not enlarged or tender.  Breasts:    Right: No mass, nipple discharge, skin change or tenderness.     Left: No mass, nipple discharge, skin change or tenderness.  Neck:     Thyroid: No thyromegaly.  Cardiovascular:     Rate and Rhythm: Normal rate and regular rhythm.     Heart sounds: Normal heart sounds. No murmur heard. Pulmonary:     Effort: Pulmonary effort is normal.     Breath sounds: Normal breath sounds.  Abdominal:     Palpations: Abdomen is soft.     Tenderness: There is no abdominal tenderness. There is no guarding or rebound.  Musculoskeletal:        General: Normal range of motion.     Cervical back: Normal range of motion.  Lymphadenopathy:     Cervical: No cervical adenopathy.  Neurological:     General: No focal deficit present.     Mental Status: She is alert and oriented to person, place, and time.     Cranial Nerves: No cranial nerve deficit.  Skin:    General: Skin is warm and dry.  Psychiatric:        Mood and Affect: Mood normal.  Behavior: Behavior normal.        Thought Content: Thought content normal.        Judgment: Judgment normal.   Vitals reviewed.     Assessment/Plan: Encounter for annual routine gynecological examination  Cervical cancer screening - Plan: Cytology - PAP  Screening for HPV (human papillomavirus) - Plan: Cytology - PAP  Encounter for screening mammogram for malignant neoplasm of breast - Plan: MM 3D SCREENING MAMMOGRAM BILATERAL BREAST; pt to schedule mammo  Herpes simplex vulvovaginitis - Plan: valACYclovir  (VALTREX ) 500 MG tablet; Rx RF. Pt to take either daily as preventive or 1 wk before sx start to prevent outbreak. F/u prn.   Abnormal uterine bleeding (AUB) - Plan: US  PELVIS TRANSVAGINAL NON-OB (TV ONLY); for several months with heavier flow, check GYN u/s. Will f/u with results and mgmt. May be perimenopausal vs leio.    No orders of the defined types were placed in this encounter.            GYN counsel mammography screening, adequate intake of calcium and vitamin D, diet and exercise     F/U  No follow-ups on file.  Shanessa Hodak B. Benson Porcaro, PA-C 02/01/2024 3:56 PM

## 2024-02-06 ENCOUNTER — Ambulatory Visit (INDEPENDENT_AMBULATORY_CARE_PROVIDER_SITE_OTHER): Admitting: Obstetrics and Gynecology

## 2024-02-06 ENCOUNTER — Encounter: Payer: Self-pay | Admitting: Obstetrics and Gynecology

## 2024-02-06 ENCOUNTER — Ambulatory Visit: Admitting: Dermatology

## 2024-02-06 ENCOUNTER — Encounter: Payer: Self-pay | Admitting: Dermatology

## 2024-02-06 VITALS — BP 116/65 | HR 77 | Ht 64.0 in | Wt 167.0 lb

## 2024-02-06 DIAGNOSIS — Z79810 Long term (current) use of selective estrogen receptor modulators (SERMs): Secondary | ICD-10-CM | POA: Insufficient documentation

## 2024-02-06 DIAGNOSIS — D225 Melanocytic nevi of trunk: Secondary | ICD-10-CM

## 2024-02-06 DIAGNOSIS — B360 Pityriasis versicolor: Secondary | ICD-10-CM

## 2024-02-06 DIAGNOSIS — Z01419 Encounter for gynecological examination (general) (routine) without abnormal findings: Secondary | ICD-10-CM | POA: Diagnosis not present

## 2024-02-06 DIAGNOSIS — D492 Neoplasm of unspecified behavior of bone, soft tissue, and skin: Secondary | ICD-10-CM | POA: Diagnosis not present

## 2024-02-06 DIAGNOSIS — D229 Melanocytic nevi, unspecified: Secondary | ICD-10-CM

## 2024-02-06 DIAGNOSIS — N939 Abnormal uterine and vaginal bleeding, unspecified: Secondary | ICD-10-CM

## 2024-02-06 DIAGNOSIS — D1801 Hemangioma of skin and subcutaneous tissue: Secondary | ICD-10-CM

## 2024-02-06 DIAGNOSIS — L219 Seborrheic dermatitis, unspecified: Secondary | ICD-10-CM

## 2024-02-06 DIAGNOSIS — A6004 Herpesviral vulvovaginitis: Secondary | ICD-10-CM

## 2024-02-06 DIAGNOSIS — L578 Other skin changes due to chronic exposure to nonionizing radiation: Secondary | ICD-10-CM

## 2024-02-06 DIAGNOSIS — L821 Other seborrheic keratosis: Secondary | ICD-10-CM

## 2024-02-06 DIAGNOSIS — D489 Neoplasm of uncertain behavior, unspecified: Secondary | ICD-10-CM

## 2024-02-06 DIAGNOSIS — L700 Acne vulgaris: Secondary | ICD-10-CM | POA: Diagnosis not present

## 2024-02-06 DIAGNOSIS — Z7189 Other specified counseling: Secondary | ICD-10-CM

## 2024-02-06 DIAGNOSIS — Z86018 Personal history of other benign neoplasm: Secondary | ICD-10-CM

## 2024-02-06 DIAGNOSIS — W908XXA Exposure to other nonionizing radiation, initial encounter: Secondary | ICD-10-CM

## 2024-02-06 DIAGNOSIS — N6091 Unspecified benign mammary dysplasia of right breast: Secondary | ICD-10-CM

## 2024-02-06 DIAGNOSIS — Z1283 Encounter for screening for malignant neoplasm of skin: Secondary | ICD-10-CM | POA: Diagnosis not present

## 2024-02-06 DIAGNOSIS — D239 Other benign neoplasm of skin, unspecified: Secondary | ICD-10-CM

## 2024-02-06 DIAGNOSIS — Z79899 Other long term (current) drug therapy: Secondary | ICD-10-CM

## 2024-02-06 DIAGNOSIS — Z1231 Encounter for screening mammogram for malignant neoplasm of breast: Secondary | ICD-10-CM

## 2024-02-06 DIAGNOSIS — L814 Other melanin hyperpigmentation: Secondary | ICD-10-CM

## 2024-02-06 DIAGNOSIS — Z1211 Encounter for screening for malignant neoplasm of colon: Secondary | ICD-10-CM

## 2024-02-06 HISTORY — DX: Other benign neoplasm of skin, unspecified: D23.9

## 2024-02-06 MED ORDER — VALACYCLOVIR HCL 500 MG PO TABS: 500.0000 mg | ORAL_TABLET | Freq: Every evening | ORAL | 3 refills | Status: DC

## 2024-02-06 MED ORDER — WINLEVI 1 % EX CREA: TOPICAL_CREAM | CUTANEOUS | 6 refills | Status: AC

## 2024-02-06 MED ORDER — KETOCONAZOLE 2 % EX SHAM: MEDICATED_SHAMPOO | CUTANEOUS | 11 refills | Status: AC

## 2024-02-06 NOTE — Patient Instructions (Addendum)
 Send mychart in 3 months and let us  know how your acne at chin is doing.    Biopsy Wound Care Instructions  Leave the original bandage on for 24 hours if possible.  If the bandage becomes soaked or soiled before that time, it is OK to remove it and examine the wound.  A small amount of post-operative bleeding is normal.  If excessive bleeding occurs, remove the bandage, place gauze over the site and apply continuous pressure (no peeking) over the area for 30 minutes. If this does not work, please call our clinic as soon as possible or page your doctor if it is after hours.   Once a day, cleanse the wound with soap and water . It is fine to shower. If a thick crust develops you may use a Q-tip dipped into dilute hydrogen peroxide (mix 1:1 with water ) to dissolve it.  Hydrogen peroxide can slow the healing process, so use it only as needed.    After washing, apply petroleum jelly (Vaseline) or an antibiotic ointment if your doctor prescribed one for you, followed by a bandage.    For best healing, the wound should be covered with a layer of ointment at all times. If you are not able to keep the area covered with a bandage to hold the ointment in place, this may mean re-applying the ointment several times a day.  Continue this wound care until the wound has healed and is no longer open.   Itching and mild discomfort is normal during the healing process. However, if you develop pain or severe itching, please call our office.   If you have any discomfort, you can take Tylenol  (acetaminophen ) or ibuprofen  as directed on the bottle. (Please do not take these if you have an allergy to them or cannot take them for another reason).  Some redness, tenderness and white or yellow material in the wound is normal healing.  If the area becomes very sore and red, or develops a thick yellow-green material (pus), it may be infected; please notify us .    If you have stitches, return to clinic as directed to have the  stitches removed. You will continue wound care for 2-3 days after the stitches are removed.   Wound healing continues for up to one year following surgery. It is not unusual to experience pain in the scar from time to time during the interval.  If the pain becomes severe or the scar thickens, you should notify the office.    A slight amount of redness in a scar is expected for the first six months.  After six months, the redness will fade and the scar will soften and fade.  The color difference becomes less noticeable with time.  If there are any problems, return for a post-op surgery check at your earliest convenience.  To improve the appearance of the scar, you can use silicone scar gel, cream, or sheets (such as Mederma or Serica) every night for up to one year. These are available over the counter (without a prescription).  Please call our office at [phone] for any questions or concerns.        Tinea versicolor is a chronic recurrent skin rash causing discolored scaly spots most commonly seen on back, chest, and/or shoulders.  It is generally asymptomatic. The rash is due to overgrowth of a common type of yeast present on everyone's skin and it is not contagious.  It tends to flare more in the summer due to increased sweating  on trunk.  After rash is treated, the scaliness will resolve, but the discoloration will take longer to return to normal pigmentation. The periodic use of an OTC medicated soap/shampoo with zinc or selenium sulfide can be helpful to prevent yeast overgrowth and recurrence.  Can use ketoconazole  shampoo as a body wash daily to back and neck to help clear.       Melanoma ABCDEs  Melanoma is the most dangerous type of skin cancer, and is the leading cause of death from skin disease.  You are more likely to develop melanoma if you: Have light-colored skin, light-colored eyes, or red or blond hair Spend a lot of time in the sun Tan regularly, either outdoors or  in a tanning bed Have had blistering sunburns, especially during childhood Have a close family member who has had a melanoma Have atypical moles or large birthmarks  Early detection of melanoma is key since treatment is typically straightforward and cure rates are extremely high if we catch it early.   The first sign of melanoma is often a change in a mole or a new dark spot.  The ABCDE system is a way of remembering the signs of melanoma.  A for asymmetry:  The two halves do not match. B for border:  The edges of the growth are irregular. C for color:  A mixture of colors are present instead of an even brown color. D for diameter:  Melanomas are usually (but not always) greater than 6mm - the size of a pencil eraser. E for evolution:  The spot keeps changing in size, shape, and color.  Please check your skin once per month between visits. You can use a small mirror in front and a large mirror behind you to keep an eye on the back side or your body.   If you see any new or changing lesions before your next follow-up, please call to schedule a visit.  Please continue daily skin protection including broad spectrum sunscreen SPF 30+ to sun-exposed areas, reapplying every 2 hours as needed when you're outdoors.   Staying in the shade or wearing long sleeves, sun glasses (UVA+UVB protection) and wide brim hats (4-inch brim around the entire circumference of the hat) are also recommended for sun protection.    Due to recent changes in healthcare laws, you may see results of your pathology and/or laboratory studies on MyChart before the doctors have had a chance to review them. We understand that in some cases there may be results that are confusing or concerning to you. Please understand that not all results are received at the same time and often the doctors may need to interpret multiple results in order to provide you with the best plan of care or course of treatment. Therefore, we ask that you  please give us  2 business days to thoroughly review all your results before contacting the office for clarification. Should we see a critical lab result, you will be contacted sooner.   If You Need Anything After Your Visit  If you have any questions or concerns for your doctor, please call our main line at [phone] and press option 4 to reach your doctor's medical assistant. If no one answers, please leave a voicemail as directed and we will return your call as soon as possible. Messages left after 4 pm will be answered the following business day.   You may also send us  a message via MyChart. We typically respond to MyChart messages within 1-2 business days.  For prescription refills, please ask your pharmacy to contact our office. Our fax number is [phone].  If you have an urgent issue when the clinic is closed that cannot wait until the next business day, you can page your doctor at the number below.    Please note that while we do our best to be available for urgent issues outside of office hours, we are not available 24/7.   If you have an urgent issue and are unable to reach us , you may choose to seek medical care at your doctor's office, retail clinic, urgent care center, or emergency room.  If you have a medical emergency, please immediately call 911 or go to the emergency department.  Pager Numbers  - Dr. Bary Likes: [phone]  - Dr. Annette Barters: [phone]  - Dr. Felipe Horton: [phone]   In the event of inclement weather, please call our main line at [phone] for an update on the status of any delays or closures.  Dermatology Medication Tips: Please keep the boxes that topical medications come in in order to help keep track of the instructions about where and how to use these. Pharmacies typically print the medication instructions only on the boxes and not directly on the medication tubes.   If your medication is too expensive, please contact our office at  [phone] option 4 or send us  a message through MyChart.   We are unable to tell what your co-pay for medications will be in advance as this is different depending on your insurance coverage. However, we may be able to find a substitute medication at lower cost or fill out paperwork to get insurance to cover a needed medication.   If a prior authorization is required to get your medication covered by your insurance company, please allow us  1-2 business days to complete this process.  Drug prices often vary depending on where the prescription is filled and some pharmacies may offer cheaper prices.  The website www.goodrx.com contains coupons for medications through different pharmacies. The prices here do not account for what the cost may be with help from insurance (it may be cheaper with your insurance), but the website can give you the price if you did not use any insurance.  - You can print the associated coupon and take it with your prescription to the pharmacy.  - You may also stop by our office during regular business hours and pick up a GoodRx coupon card.  - If you need your prescription sent electronically to a different pharmacy, notify our office through [facility] or by phone at [phone] option 4.     Si Usted Necesita Algo Despus de Su Visita  Tambin puede enviarnos un mensaje a travs de Clinical cytogeneticist. Por lo general respondemos a los mensajes de MyChart en el transcurso de 1 a 2 das hbiles.  Para renovar recetas, por favor pida a su farmacia que se ponga en contacto con nuestra oficina. Franz Jacks de fax es [place] [phone].  Si tiene un asunto urgente cuando la clnica est cerrada y que no puede esperar hasta el siguiente da hbil, puede llamar/localizar a su doctor(a) al nmero que aparece a continuacin.   Por favor, tenga en cuenta que aunque hacemos todo lo posible para estar disponibles para asuntos urgentes fuera del horario de [place], no estamos  disponibles las 24 horas del da, los 7 [address]  [address] de la [place].   Si tiene un problema urgente y no puede comunicarse con nosotros, puede optar por buscar atencin mdica  en el consultorio de su doctor(a), en una clnica privada, en un centro de atencin urgente o en una sala de emergencias.  Si tiene Engineer, drilling, por favor llame inmediatamente al 911 o vaya a la sala de emergencias.  Nmeros de bper  - Dr. Bary Likes: [phone]  - Dra. Annette Barters: [phone]  - Dr. Felipe Horton: [phone]   En caso de inclemencias del tiempo, por favor llame a Lajuan Pila principal al [phone] para una actualizacin sobre el [place] de cualquier retraso o cierre.  Consejos para la medicacin en dermatologa: Por favor, guarde las cajas en las que vienen los medicamentos de uso tpico para ayudarle a seguir las instrucciones sobre dnde y cmo usarlos. Las farmacias generalmente imprimen las instrucciones del medicamento slo en las cajas y no directamente en los tubos del [place].   Si su medicamento es muy caro, por favor, pngase en contacto con Bettyjane Brunet llamando al [phone] y presione la opcin 4 o envenos un mensaje a travs de Clinical cytogeneticist.   No podemos decirle cul ser su copago por los medicamentos por adelantado ya que esto es diferente dependiendo de la cobertura de su seguro. Sin embargo, es posible que podamos encontrar un medicamento sustituto a Audiological scientist un formulario para que el seguro cubra el medicamento que se considera necesario.   Si se requiere una autorizacin previa para que su compaa de seguros [place] su medicamento, por favor permtanos de 1 a 2 das hbiles para completar este proceso.  Los precios de los medicamentos varan con frecuencia dependiendo del Environmental consultant de dnde se surte la receta y alguna farmacias pueden ofrecer precios ms baratos.  El sitio web www.goodrx.com tiene cupones para medicamentos de Health and safety inspector. Los precios aqu no  tienen en cuenta lo que podra costar con la ayuda del seguro (puede ser ms barato con su seguro), pero el sitio web puede darle el precio si no utiliz Tourist information centre manager.  - Puede imprimir el cupn correspondiente y llevarlo con su receta a la farmacia.  - Tambin puede pasar por nuestra oficina durante el horario de atencin regular y Education officer, museum una tarjeta de cupones de GoodRx.  - Si necesita que su receta se enve electrnicamente a una farmacia diferente, informe a nuestra oficina a travs de MyChart de [place] o por telfono llamando al [phone] y presione la opcin 4.

## 2024-02-06 NOTE — Patient Instructions (Signed)
 I value your feedback and you entrusting [place] with your care. If you get a [place] patient survey, I would appreciate you taking the time to let us know about your experience today. Thank you! ? ? ?

## 2024-02-06 NOTE — Progress Notes (Signed)
 Follow-Up Visit   Subjective  Penny Tapia is a 48 y.o. female who presents for the following: Skin Cancer Screening and Full Body Skin Exam Hx of dysplastic nevi, hx of seb derm using ketoconazole  shampoo, Patient reports she is currently taking  tamoxifen  to help reduce breast cancer risk believes is causing breakouts at face. Would like to discuss treatement.   The patient presents for Total-Body Skin Exam (TBSE) for skin cancer screening and mole check. The patient has spots, moles and lesions to be evaluated, some may be new or changing and the patient may have concern these could be cancer.  The following portions of the chart were reviewed this encounter and updated as appropriate: medications, allergies, medical history  Review of Systems:  No other skin or systemic complaints except as noted in HPI or Assessment and Plan.  Objective  Well appearing patient in no apparent distress; mood and affect are within normal limits.  A full examination was performed including scalp, head, eyes, ears, nose, lips, neck, chest, axillae, abdomen, back, buttocks, bilateral upper extremities, bilateral lower extremities, hands, feet, fingers, toes, fingernails, and toenails. All findings within normal limits unless otherwise noted below.   Relevant physical exam findings are noted in the Assessment and Plan.  left medial scapula 0.7 cm irregular brown macule    Assessment & Plan   SKIN CANCER SCREENING PERFORMED TODAY.  ACTINIC DAMAGE - Chronic condition, secondary to cumulative UV/sun exposure - diffuse scaly erythematous macules with underlying dyspigmentation - Recommend daily broad spectrum sunscreen SPF 30+ to sun-exposed areas, reapply every 2 hours as needed.  - Staying in the shade or wearing long sleeves, sun glasses (UVA+UVB protection) and wide brim hats (4-inch brim around the entire circumference of the hat) are also recommended for sun protection.  - Call for new or  changing lesions.  LENTIGINES, SEBORRHEIC KERATOSES, HEMANGIOMAS - Benign normal skin lesions - Benign-appearing - Call for any changes  MELANOCYTIC NEVI - Tan-brown and/or pink-flesh-colored symmetric macules and papules - Benign appearing on exam today - Observation - Call clinic for new or changing moles - Recommend daily use of broad spectrum spf 30+ sunscreen to sun-exposed areas.   HISTORY OF DYSPLASTIC NEVUS Right medial side of foot -  mod to severe atypia - 03/09/2020 No evidence of recurrence today Recommend regular full body skin exams Recommend daily broad spectrum sunscreen SPF 30+ to sun-exposed areas, reapply every 2 hours as needed.  Call if any new or changing lesions are noted between office visits  Seborrheic dermatitis face and scalp Exam: little scale at scalp Chronic and persistent condition with duration or expected duration over one year. Condition is symptomatic / bothersome to patient. Not to goal. Seborrheic Dermatitis  -  is a chronic persistent rash characterized by pinkness and scaling most commonly of the mid face but also can occur on the scalp (dandruff), ears; mid chest, mid back and groin.  It tends to be exacerbated by stress and cooler weather.  People who have neurologic disease may experience new onset or exacerbation of existing seborrheic dermatitis.  The condition is not curable but treatable and can be controlled.   Cont Ketoconazole  shampoo use at least 3 x weekly alternating with otc Zinc shampoo, over the counter Tar shampoo Continue using  ketoconazole  shampoo as a face wash at face 3 x weekly     ACNE VULGARIS Exam:  couple active papules at chin Chronic and persistent condition with duration or expected duration over one  year. Condition is bothersome/symptomatic for patient. Currently flared. - 2ndary to Tamoxifen ? Treatment Plan: Start Winlevi bid to affected area at chin and other affected areas Rx sent to [facility] and coupon given  to patient    Discussed oral treatment in future as option if not improving Patient advised to send mychart in 3 mnts regarding how she is doing  Tinea Versicolor Chronic and persistent condition with duration or expected duration over one year. Condition is bothersome/symptomatic for patient. Currently flared. Apply Ketoconazole  cream twice a day as needed until clear. Advised it can take many months for affected areas to repigment.  Recommend using  ketoconazole  2% shampoo as body wash once a week, especially in summer months Recommend using on neck and shoulders    Tinea versicolor is a chronic recurrent skin rash causing discolored scaly spots most commonly seen on back, chest, and/or shoulders.  It is generally asymptomatic. The rash is due to overgrowth of a common type of yeast present on everyone's skin and it is not contagious.  It tends to flare more in the summer due to increased sweating on trunk.  After rash is treated, the scaliness will resolve, but the discoloration will take longer to return to normal pigmentation. The periodic use of an OTC medicated soap/shampoo with zinc or selenium sulfide can be helpful to prevent yeast overgrowth and recurrence.  ACNE VULGARIS   Related Medications Clascoterone (WINLEVI) 1 % CREA Apply topically to affected areas of chin twice daily for acne SEBORRHEIC DERMATITIS   Related Medications ketoconazole  (NIZORAL ) 2 % shampoo apply topically x 3 times weekly to scalp and massage into scalp and leave in for 3 - 5 minutes before rinsing out. Can also use 3 x weekly as a face wash to aa of face NEOPLASM OF UNCERTAIN BEHAVIOR left medial scapula Epidermal / dermal shaving  Lesion diameter (cm):  0.7 Informed consent: discussed and consent obtained   Timeout: patient name, date of birth, surgical site, and procedure verified   Procedure prep:  Patient was prepped and draped in usual sterile fashion Prep type:  Isopropyl  alcohol Anesthesia: the lesion was anesthetized in a standard fashion   Anesthetic:  1% lidocaine  w/ epinephrine  1-100,000 buffered w/ 8.4% NaHCO3 Instrument used: flexible razor blade   Hemostasis achieved with: pressure, aluminum chloride and electrodesiccation   Outcome: patient tolerated procedure well   Post-procedure details: sterile dressing applied and wound care instructions given   Dressing type: bandage and petrolatum   Specimen 1 - Surgical pathology Differential Diagnosis: nevus r/o dysplasia   Check Margins: yes Nevus r/o dysplasia Return in about 1 year (around 02/05/2025) for TBSE.  IRandee Busing, CMA, am acting as scribe for Celine Collard, MD.   Documentation: I have reviewed the above documentation for accuracy and completeness, and I agree with the above.  Celine Collard, MD

## 2024-02-07 ENCOUNTER — Ambulatory Visit (INDEPENDENT_AMBULATORY_CARE_PROVIDER_SITE_OTHER): Admitting: Dermatology

## 2024-02-09 LAB — SURGICAL PATHOLOGY

## 2024-02-12 ENCOUNTER — Ambulatory Visit: Payer: Self-pay | Admitting: Dermatology

## 2024-02-13 ENCOUNTER — Encounter: Payer: Self-pay | Admitting: Dermatology

## 2024-02-13 NOTE — Telephone Encounter (Signed)
 Patient informed of pathology results

## 2024-02-13 NOTE — Telephone Encounter (Signed)
-----   Message from Celine Collard sent at 02/12/2024  7:36 PM EDT ----- FINAL DIAGNOSIS        1. Skin, left medial scapula :       DYSPLASTIC JUNCTIONAL NEVUS WITH MODERATE ATYPIA, LIMITED MARGINS FREE   Moderate dysplastic Recheck next visit

## 2024-02-20 ENCOUNTER — Encounter

## 2024-02-20 ENCOUNTER — Other Ambulatory Visit

## 2024-02-20 ENCOUNTER — Ambulatory Visit
Admission: RE | Admit: 2024-02-20 | Discharge: 2024-02-20 | Disposition: A | Discharge status: Home / Self Care | Source: Ambulatory Visit | Attending: Oncology | Admitting: Oncology

## 2024-02-20 DIAGNOSIS — Z1231 Encounter for screening mammogram for malignant neoplasm of breast: Secondary | ICD-10-CM | POA: Diagnosis present

## 2024-04-30 ENCOUNTER — Encounter: Payer: Self-pay | Admitting: Oncology

## 2024-04-30 ENCOUNTER — Inpatient Hospital Stay: Attending: Oncology | Admitting: Oncology

## 2024-04-30 ENCOUNTER — Other Ambulatory Visit: Payer: Self-pay

## 2024-04-30 VITALS — BP 107/51 | HR 77 | Temp 96.6°F | Resp 18 | Ht 64.0 in | Wt 165.6 lb

## 2024-04-30 DIAGNOSIS — N6091 Unspecified benign mammary dysplasia of right breast: Secondary | ICD-10-CM | POA: Insufficient documentation

## 2024-04-30 DIAGNOSIS — Z807 Family history of other malignant neoplasms of lymphoid, hematopoietic and related tissues: Secondary | ICD-10-CM | POA: Diagnosis not present

## 2024-04-30 DIAGNOSIS — Z801 Family history of malignant neoplasm of trachea, bronchus and lung: Secondary | ICD-10-CM | POA: Insufficient documentation

## 2024-04-30 DIAGNOSIS — Z79899 Other long term (current) drug therapy: Secondary | ICD-10-CM

## 2024-04-30 DIAGNOSIS — Z8049 Family history of malignant neoplasm of other genital organs: Secondary | ICD-10-CM | POA: Diagnosis not present

## 2024-04-30 DIAGNOSIS — Z79810 Long term (current) use of selective estrogen receptor modulators (SERMs): Secondary | ICD-10-CM | POA: Insufficient documentation

## 2024-04-30 DIAGNOSIS — Z800 Family history of malignant neoplasm of digestive organs: Secondary | ICD-10-CM | POA: Insufficient documentation

## 2024-04-30 MED ORDER — TAMOXIFEN CITRATE 10 MG PO TABS: 10.0000 mg | ORAL_TABLET | ORAL | 5 refills | Status: AC

## 2024-04-30 NOTE — Progress Notes (Signed)
 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  This was followed by diagnostic mammogram and ultrasound which showed 1.4 x 0.4 x 1 cm group of microcalcifications at the 2 o'clock position 9 cm from the nipple.  No evidence of right axillary adenopathy.  This was biopsy and was consistent with complex sclerosing lesion.  Patient was seen by Dr. Lane and underwent definitive lumpectomy which showed complex sclerosing lesion 15 mm with usual ductal hyperplasia and atypical lobular hyperplasia.  Extensive adenosis with calcifications and ALH.  Additional superior lateral margin also showed usual ductal hyperplasia apocrine metaplasia and sclerosing adenosis.  Negative for atypia and malignancy.    Patient is being followed by GYN and was being evaluated for abnormal uterine bleeding.  She had endometrial biopsy which was negative and therefore patient started taking tamoxifen  20 mg daily in September 2024.  She was having more fatigue and joint pains with this dosing and therefore takes tamoxifen  10 mg every other day.  Genetic testing negative.  She has been getting mammograms alternating with  MRI January 2024 showed indeterminant enhancement in the right breast spanning 1.4 cm which was biopsy-proven complex sclerosing lesion.  She underwent a lumpectomy for the same which again showed complex sclerosing lesion but no  evidence of atypia or malignancy    Interval history-occasional pain at the lumpectomy site.  She is currently on tamoxifen  10 mg every other day and is tolerating it well without any significant side effects.  Menstrual cycles are short but regular  ECOG PS- 0 Pain scale- 0   Review of systems- Review of Systems  Constitutional:  Negative for chills, fever, malaise/fatigue and weight loss.  HENT:  Negative for congestion, ear discharge and nosebleeds.   Eyes:  Negative for blurred vision.  Respiratory:  Negative for cough, hemoptysis, sputum production, shortness of breath and wheezing.   Cardiovascular:  Negative for chest pain, palpitations, orthopnea and claudication.  Gastrointestinal:  Negative for abdominal pain, blood in stool, constipation, diarrhea, heartburn, melena, nausea and vomiting.  Genitourinary:  Negative for dysuria, flank pain, frequency, hematuria and urgency.  Musculoskeletal:  Negative for back pain, joint pain and myalgias.  Skin:  Negative for rash.  Neurological:  Negative for dizziness, tingling, focal weakness, seizures, weakness and headaches.  Endo/Heme/Allergies:  Does not bruise/bleed easily.  Psychiatric/Behavioral:  Negative for depression and suicidal ideas. The patient does not have insomnia.       No Known Allergies   Past Medical History:  Diagnosis Date   Abnormal vaginal Pap smear 2000   Anemia    after 2nd pregnancy only   Atypical lobular hyperplasia (ALH) of right breast    Complex sclerosing lesion of right breast    Dysplastic nevus 02/06/2024   L med scapula - moderate   Herpes genitalis    Hx of dysplastic nevus 03/09/2020   R medial side of foot. Moderate to severe atypia, peripheral margin involved. Excised 06/02/2020. Margins free.   Radial scar of breast  Past Surgical History:  Procedure Laterality Date   BREAST BIOPSY Right 03/06/2023   US  Bx, Ribbon Clip, path pending   BREAST BIOPSY Right 03/06/2023   US  RT  BREAST BX W LOC DEV 1ST LESION IMG BX SPEC US  GUIDE 03/06/2023 [facility]   BREAST BIOPSY Right 09/17/2023   MR guided Bx- COMPLEX SCLEROSING LESION   BREAST BIOPSY Right 10/11/2023   MM RT RADIO FREQUENCY TAG LOC MAMMO GUIDE 10/11/2023 [facility]   BREAST BIOPSY WITH RADIO FREQUENCY LOCALIZER Right 03/24/2023   Procedure: BREAST BIOPSY WITH RADIO FREQUENCY LOCALIZER;  Surgeon: Lane Shope, MD;  Location: [facility];  Service: General;  Laterality: Right;   BREAST BIOPSY WITH RADIO FREQUENCY LOCALIZER Right 10/16/2023   Procedure: BREAST BIOPSY WITH RADIO FREQUENCY LOCALIZER;  Surgeon: Lane Shope, MD;  Location: [facility];  Service: General;  Laterality: Right;   BREAST EXCISIONAL BIOPSY Right 10/2023   COMPLEX SCLEROSING LESION.   Cervical cryotherapy  04/13/1999   DILATION AND CURETTAGE OF UTERUS  09/2010   FRACTURE SURGERY     arm surgery    Social History   Socioeconomic History   Marital status: Married    Spouse name: Roland   Number of children: Not on file   Years of education: Not on file   Highest education level: Not on file  Occupational History   Not on file  Tobacco Use   Smoking status: Never    Passive exposure: Never   Smokeless tobacco: Never  Vaping Use   Vaping status: Never Used  Substance and Sexual Activity   Alcohol use: Yes    Comment: occ   Drug use: Never   Sexual activity: Yes    Birth control/protection: Surgical    Comment: Vasectomy  Other Topics Concern   Not on file  Social History Narrative   Not on file   Social Drivers of Health   Financial Resource Strain: Low Risk  (12/28/2023)   Received from [facility]   Overall Financial Resource Strain (CARDIA)    Difficulty of Paying Living Expenses: Not hard at all  Food Insecurity: No Food Insecurity (12/28/2023)   Received [facility]   Hunger Vital Sign    Within the past 12 months, you worried that your food would run out  before you got the money to buy more.: Never true    Within the past 12 months, the food you bought just didn't last and you didn't have money to get more.: Never true  Transportation Needs: No Transportation Needs (12/28/2023)   Received from [facility] - Transportation    In the past 12 months, has lack of transportation kept you from medical appointments or from getting medications?: No    Lack of Transportation (Non-Medical): No  Physical Activity: Sufficiently Active (12/19/2019)   Received from [facility]   Exercise Vital Sign    On average, how many days per week do you engage in moderate to strenuous exercise (like a brisk walk)?: 6 days    On average, how many minutes do you engage in exercise at this level?: 40 min  Stress: No Stress Concern Present (12/19/2019)   Received from [facility] - Occupational Stress Questionnaire    Feeling of Stress : Only a little  Social Connections: Moderately Integrated (12/19/2019)   Received from [facility]   Social Connection and Isolation Panel  In a typical week, how many times do you talk on the phone with family, friends, or neighbors?: Three times a week    How often do you get together with friends or relatives?: Never    How often do you attend church or religious services?: More than 4 times per year    Do you belong to any clubs or organizations such as church groups, unions, fraternal or athletic groups, or school groups?: No    How often do you attend meetings of the clubs or organizations you belong to?: Never    Are you married, widowed, divorced, separated, never married, or living with a partner?: Married  Intimate Partner Violence: Not At Risk (04/17/2023)   Humiliation, Afraid, Rape, and Kick questionnaire    Fear of Current or Ex-Partner: No    Emotionally Abused: No    Physically Abused: No    Sexually  Abused: No    Family History  Problem Relation Age of Onset   Skin cancer Mother        x3   Liver cancer Father 55   Lymphoma Paternal Uncle    Diabetes Maternal Grandmother    Uterine cancer Maternal Grandmother 28   Lung cancer Maternal Grandfather 58   Colon cancer Paternal Grandfather 73   Breast cancer Neg Hx      Current Outpatient Medications:    Ascorbic Acid (VITAMIN C PO), Take 1 tablet by mouth daily., Disp: , Rfl:    Cholecalciferol (VITAMIN D-1000 MAX ST) 25 MCG (1000 UT) tablet, Take 1,000 Units by mouth daily., Disp: , Rfl:    Cyanocobalamin (VITAMIN B-12 PO), Take 1 tablet by mouth daily., Disp: , Rfl:    fluticasone (FLONASE) 50 MCG/ACT nasal spray, Place 2 sprays into both nostrils daily as needed for allergies or rhinitis., Disp: , Rfl:    ketoconazole  (NIZORAL ) 2 % shampoo, apply topically x 3 times weekly to scalp and massage into scalp and leave in for 3 - 5 minutes before rinsing out. Can also use 3 x weekly as a face wash to aa of face, Disp: 120 mL, Rfl: 11   Multiple Vitamin (MULTI-VITAMIN) tablet, Take 1 tablet by mouth daily., Disp: , Rfl:    valACYclovir  (VALTREX ) 500 MG tablet, Take 1 tablet (500 mg total) by mouth every evening. Or BID for 3 days prn sx, Disp: 90 tablet, Rfl: 3   Clascoterone  (WINLEVI ) 1 % CREA, Apply topically to affected areas of chin twice daily for acne (Patient not taking: Reported on 04/30/2024), Disp: 60 g, Rfl: 6   tamoxifen  (NOLVADEX ) 10 MG tablet, Take 1 tablet (10 mg total) by mouth every other day., Disp: 30 tablet, Rfl: 5  Physical exam:  Vitals:   04/30/24 1012  BP: (!) 107/51  Pulse: 77  Resp: 18  Temp: (!) 96.6 F (35.9 C)  TempSrc: Tympanic  SpO2: 100%  Weight: 165 lb 9.6 oz (75.1 kg)  Height: 5' 4 (1.626 m)   Physical Exam Cardiovascular:     Rate and Rhythm: Normal rate and regular rhythm.     Heart sounds: Normal heart sounds.  Pulmonary:     Effort: Pulmonary effort is normal.     Breath sounds:  Normal breath sounds.  Skin:    General: Skin is warm and dry.  Neurological:     Mental Status: She is alert and oriented to person, place, and time.    Breast exam was performed in seated and lying down position. Patient is status post  right lumpectomy with a well-healed surgical scar. No evidence of any palpable masses. No evidence of axillary adenopathy. No evidence of any palpable masses or lumps in the left breast. No evidence of leftt axillary adenopathy   I have personally reviewed labs listed below:    Latest Ref Rng & Units 03/24/2023    8:51 AM  CMP  Glucose 70 - 99 mg/dL 97   BUN 6 - 20 mg/dL 8   Creatinine 9.55 - 8.99 mg/dL 9.30   Sodium 864 - 854 mmol/L 138   Potassium 3.5 - 5.1 mmol/L 3.9   Chloride 98 - 111 mmol/L 108   CO2 22 - 32 mmol/L 25   Calcium 8.9 - 10.3 mg/dL 8.8   Total Protein 6.5 - 8.1 g/dL 6.4   Total Bilirubin 0.3 - 1.2 mg/dL 0.6   Alkaline Phos 38 - 126 U/L 37   AST 15 - 41 U/L 17   ALT 0 - 44 U/L 17       Latest Ref Rng & Units 03/24/2023    8:51 AM  CBC  WBC 4.0 - 10.5 K/uL 5.1   Hemoglobin 12.0 - 15.0 g/dL 87.2   Hematocrit 63.9 - 46.0 % 38.3   Platelets 150 - 400 K/uL 265      Assessment and plan- Patient is a 48 y.o. female with history of atypical lobular hyperplasia of the right breast presently on tamoxifen .  She is here for a routine surveillance visit  Patient's Mammogram from June 2025 was overall unremarkable.  She is presently on tamoxifen  10 mg every other day for chemoprevention which she is tolerating it well.  She will continue this for 5 years ending in 2029 September.  I will see her back in 6 months no labs   Visit Diagnosis 1. Encounter for monitoring tamoxifen  therapy   2. High risk medication use   3. Atypical lobular hyperplasia [facility]) of right breast      Dr. Annah Skene, MD, MPH [facility] [phone] 04/30/2024 12:54 PM

## 2024-05-27 ENCOUNTER — Other Ambulatory Visit: Payer: Self-pay | Admitting: Obstetrics and Gynecology

## 2024-05-27 DIAGNOSIS — A6004 Herpesviral vulvovaginitis: Secondary | ICD-10-CM

## 2024-07-01 ENCOUNTER — Other Ambulatory Visit: Payer: Self-pay | Admitting: Obstetrics and Gynecology

## 2024-07-01 ENCOUNTER — Encounter: Payer: Self-pay | Admitting: Obstetrics and Gynecology

## 2024-07-01 DIAGNOSIS — A6004 Herpesviral vulvovaginitis: Secondary | ICD-10-CM

## 2024-07-01 MED ORDER — VALACYCLOVIR HCL 1 G PO TABS: 1000.0000 mg | ORAL_TABLET | Freq: Every day | ORAL | 3 refills | Status: AC

## 2024-07-01 NOTE — Progress Notes (Signed)
 Rx valtrex  1 g daily to prevent HSV infections

## 2024-10-15 ENCOUNTER — Ambulatory Visit: Admitting: Oncology

## 2024-10-18 ENCOUNTER — Other Ambulatory Visit: Payer: Self-pay

## 2024-10-18 ENCOUNTER — Inpatient Hospital Stay: Admitting: Oncology

## 2024-10-18 ENCOUNTER — Encounter: Payer: Self-pay | Admitting: Oncology

## 2024-10-18 VITALS — BP 101/55 | HR 73 | Temp 97.9°F | Resp 18 | Ht 64.0 in | Wt 170.8 lb

## 2024-10-18 DIAGNOSIS — N6091 Unspecified benign mammary dysplasia of right breast: Secondary | ICD-10-CM

## 2024-10-18 DIAGNOSIS — Z79899 Other long term (current) drug therapy: Secondary | ICD-10-CM

## 2024-10-18 DIAGNOSIS — Z5181 Encounter for therapeutic drug level monitoring: Secondary | ICD-10-CM

## 2024-10-18 NOTE — Progress Notes (Signed)
 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  This was followed by diagnostic mammogram and ultrasound which showed 1.4 x 0.4 x 1 cm group of microcalcifications at the 2 o'clock position 9 cm from the nipple.  No evidence of right axillary adenopathy.  This was biopsy and was consistent with complex sclerosing lesion.  Patient was seen by Dr. Lane and underwent definitive lumpectomy which showed complex sclerosing lesion 15 mm with usual ductal hyperplasia and atypical lobular hyperplasia.  Extensive adenosis with calcifications and ALH.  Additional superior lateral margin also showed usual ductal hyperplasia apocrine metaplasia and sclerosing adenosis.  Negative for atypia and malignancy.    Patient is being followed by GYN and was being evaluated for abnormal uterine bleeding.  She had endometrial biopsy which was negative and therefore patient started taking tamoxifen  20 mg daily in September 2024.  She was having more fatigue and joint pains with this dosing and therefore takes tamoxifen  10 mg every other day.  Genetic testing negative.  She has been getting mammograms alternating with  MRI January 2024 showed indeterminant enhancement in the right breast spanning 1.4 cm which was biopsy-proven complex sclerosing lesion.  She underwent a lumpectomy for the same which again showed complex  sclerosing lesion but no evidence of atypia or malignancy  Interval history- Penny Tapia is a 49 year old female with breast cancer on tamoxifen  who presents for evaluation of new leg pain.  She continues tamoxifen  every other day without adverse effects. Menstrual cycles remain regular, though shortened by one to two days, now lasting approximately five days. She undergoes annual ophthalmologic evaluations. She denies new breast concerns but continues to experience intermittent shooting breast pains, which have been previously discussed.  She describes new onset leg pain localized around the knee and radiating into the calf. The pain is more severe than a similar episode in the contralateral leg three years ago, which was managed with orthopedic evaluation, physical therapy, and ongoing home exercises. She has not yet scheduled an orthopedic follow-up for the current episode.      ECOG PS- 0 Pain scale- 0   Review of systems- Review of Systems  Constitutional:  Negative for chills, fever, malaise/fatigue and weight loss.  HENT:  Negative for congestion, ear discharge and nosebleeds.   Eyes:  Negative for blurred vision.  Respiratory:  Negative for cough, hemoptysis, sputum production, shortness of breath and wheezing.   Cardiovascular:  Negative for chest pain, palpitations, orthopnea and claudication.  Gastrointestinal:  Negative for abdominal pain, blood in stool, constipation, diarrhea, heartburn, melena, nausea and vomiting.  Genitourinary:  Negative for dysuria, flank pain, frequency, hematuria and urgency.  Musculoskeletal:  Negative for back pain, joint pain and myalgias.  Skin:  Negative for rash.  Neurological:  Negative for dizziness, tingling, focal weakness, seizures, weakness and headaches.  Endo/Heme/Allergies:  Does not bruise/bleed easily.  Psychiatric/Behavioral:  Negative for depression and suicidal ideas. The patient does not have insomnia.  Allergies[1]   Past Medical History:  Diagnosis Date   Abnormal vaginal Pap smear 2000   Anemia    after 2nd pregnancy only   Atypical lobular hyperplasia (ALH) of right breast    Complex sclerosing lesion of right breast    Dysplastic nevus 02/06/2024   L med scapula - moderate   Herpes genitalis    Hx of dysplastic nevus 03/09/2020   R medial side of foot. Moderate to severe atypia, peripheral margin involved. Excised 06/02/2020. Margins free.   Radial scar of breast      Past Surgical History:  Procedure Laterality Date   BREAST BIOPSY Right 03/06/2023   US  Bx, Ribbon Clip, path pending   BREAST BIOPSY Right 03/06/2023   US  RT BREAST BX W LOC DEV 1ST LESION IMG BX SPEC US  GUIDE 03/06/2023 [facility]   BREAST BIOPSY Right 09/17/2023   MR guided Bx- COMPLEX SCLEROSING LESION   BREAST BIOPSY Right 10/11/2023   MM RT RADIO FREQUENCY TAG LOC MAMMO GUIDE 10/11/2023 [facility]   BREAST BIOPSY WITH RADIO FREQUENCY LOCALIZER Right 03/24/2023   Procedure: BREAST BIOPSY WITH RADIO FREQUENCY LOCALIZER;  Surgeon: Lane Shope, MD;  Location: [facility];  Service: General;  Laterality: Right;   BREAST BIOPSY WITH RADIO FREQUENCY LOCALIZER Right 10/16/2023   Procedure: BREAST BIOPSY WITH RADIO FREQUENCY LOCALIZER;  Surgeon: Lane Shope, MD;  Location: [facility];  Service: General;  Laterality: Right;   BREAST EXCISIONAL BIOPSY Right 10/2023   COMPLEX SCLEROSING LESION.   Cervical cryotherapy  04/13/1999   DILATION AND CURETTAGE OF UTERUS  09/2010   FRACTURE SURGERY     arm surgery    Social History   Socioeconomic History   Marital status: Married    Spouse name: Roland   Number of children: Not on file   Years of education: Not on file   Highest education level: Not on file  Occupational History   Not on file  Tobacco Use   Smoking status: Never    Passive exposure: Never   Smokeless tobacco: Never  Vaping Use   Vaping status: Never Used  Substance  and Sexual Activity   Alcohol use: Yes    Comment: occ   Drug use: Never   Sexual activity: Yes    Birth control/protection: Surgical    Comment: Vasectomy  Other Topics Concern   Not on file  Social History Narrative   Not on file   Social Drivers of Health   Tobacco Use: Low Risk (10/18/2024)   Patient History    Smoking Tobacco Use: Never    Smokeless Tobacco Use: Never    Passive Exposure: Never  Financial Resource Strain: Low Risk  (12/28/2023)   Received from [facility]   Overall Financial Resource Strain (CARDIA)    Difficulty of Paying Living Expenses: Not hard at all  Food Insecurity: No Food Insecurity (12/28/2023)   [facility]   Epic    Within the past 12 months, you worried that your food would run out before you got the money to buy more.: Never true    Within the past 12 months, the food you bought just didn't last and you didn't have money to get more.: Never true  Transportation Needs: No Transportation Needs (12/28/2023)   Received from [facility] - Transportation    In the past 12 months, has lack of transportation kept you from medical appointments or from getting medications?: No  Lack of Transportation (Non-Medical): No  Physical Activity: Not on file  Stress: Not on file  Social Connections: Not on file  Intimate Partner Violence: Not At Risk (04/17/2023)   Humiliation, Afraid, Rape, and Kick questionnaire    Fear of Current or Ex-Partner: No    Emotionally Abused: No    Physically Abused: No    Sexually Abused: No  Depression (PHQ2-9): Low Risk (10/18/2024)   Depression (PHQ2-9)    PHQ-2 Score: 0  Alcohol Screen: Not on file  Housing: Low Risk  (12/28/2023)   Received from [facility]   Epic    In the last 12 months, was there a time when you were not able to pay the mortgage or rent on time?: No    In the past 12 months, how many times have you moved  where you were living?: 0    At any time in the past 12 months, were you homeless or living in a shelter (including now)?: No  Utilities: Not At Risk (12/28/2023)   Received from [facility] Utilities    Threatened with loss of utilities: No  Health Literacy: Not on file    Family History  Problem Relation Age of Onset   Skin cancer Mother        x3   Liver cancer Father 25   Lymphoma Paternal Uncle    Diabetes Maternal Grandmother    Uterine cancer Maternal Grandmother 28   Lung cancer Maternal Grandfather 33   Colon cancer Paternal Grandfather 76   Breast cancer Neg Hx     Current Medications[2]  Physical exam:  Vitals:   10/18/24 1056  BP: (!) 101/55  Pulse: 73  Resp: 18  Temp: 97.9 F (36.6 C)  TempSrc: Tympanic  SpO2: 100%  Weight: 170 lb 12.8 oz (77.5 kg)  Height: 5' 4 (1.626 m)   Physical Exam Cardiovascular:     Rate and Rhythm: Normal rate and regular rhythm.     Heart sounds: Normal heart sounds.  Pulmonary:     Effort: Pulmonary effort is normal.     Breath sounds: Normal breath sounds.  Skin:    General: Skin is warm and dry.  Neurological:     Mental Status: She is alert and oriented to person, place, and time.    Breast exam was performed in seated and lying down position. Patient is status post right lumpectomy with a well-healed surgical scar. No evidence of any palpable masses. No evidence of axillary adenopathy. No evidence of any palpable masses or lumps in the left breast. No evidence of leftt axillary adenopathy   I have personally reviewed labs listed below:    Latest Ref Rng & Units 03/24/2023    8:51 AM  CMP  Glucose 70 - 99 mg/dL 97   BUN 6 - 20 mg/dL 8   Creatinine 9.55 - 8.99 mg/dL 9.30   Sodium 864 - 854 mmol/L 138   Potassium 3.5 - 5.1 mmol/L 3.9   Chloride 98 - 111 mmol/L 108   CO2 22 - 32 mmol/L 25   Calcium 8.9 - 10.3 mg/dL 8.8   Total Protein 6.5 - 8.1 g/dL 6.4   Total Bilirubin 0.3 - 1.2 mg/dL  0.6   Alkaline Phos 38 - 126 U/L 37   AST 15 - 41 U/L 17   ALT 0 - 44 U/L 17       Latest Ref Rng & Units 03/24/2023  8:51 AM  CBC  WBC 4.0 - 10.5 K/uL 5.1   Hemoglobin 12.0 - 15.0 g/dL 87.2   Hematocrit 63.9 - 46.0 % 38.3   Platelets 150 - 400 K/uL 265       Assessment and plan- Patient is a 48 y.o. female with history of atypical lobular hyperplasia of the right breast presently on tamoxifen  here for routine follow-up  Assessment and Plan    Breast cancer on tamoxifen  therapy Breast cancer managed with tamoxifen , well-tolerated. Menstrual cycles regular, slightly shorter. No new breast symptoms; benign intermittent breast pain. - Continued tamoxifen  therapy every other day 10 mg - Reinforced importance of annual ophthalmologic exams for cataract risk. - Provided reassurance regarding benign breast pain. - Ordered screening mammogram for June. - Scheduled follow-up in six months.         Visit Diagnosis 1. Encounter for monitoring tamoxifen  therapy   2. High risk medication use   3. Atypical lobular hyperplasia [facility]) of right breast      Dr. Annah Skene, MD, MPH [facility] [phone] 10/18/2024 1:10 PM                   [1] No Known Allergies [2]  Current Outpatient Medications:    Ascorbic Acid (VITAMIN C PO), Take 1 tablet by mouth daily., Disp: , Rfl:    Cholecalciferol (VITAMIN D-1000 MAX ST) 25 MCG (1000 UT) tablet, Take 1,000 Units by mouth daily., Disp: , Rfl:    Cyanocobalamin (VITAMIN B-12 PO), Take 1 tablet by mouth daily., Disp: , Rfl:    Doxylamine Succinate, Sleep, (SLEEP AID PO), Take by mouth at bedtime., Disp: , Rfl:    fluticasone (FLONASE) 50 MCG/ACT nasal spray, Place 2 sprays into both nostrils daily as needed for allergies or rhinitis., Disp: , Rfl:    MAGNESIUM GLYCINATE PO, Take 200 mg by mouth at bedtime., Disp: , Rfl:    Multiple Vitamin (MULTI-VITAMIN) tablet, Take 1 tablet by mouth daily., Disp:  , Rfl:    tamoxifen  (NOLVADEX ) 10 MG tablet, Take 1 tablet (10 mg total) by mouth every other day., Disp: 30 tablet, Rfl: 5   valACYclovir  (VALTREX ) 1000 MG tablet, Take 1 tablet (1,000 mg total) by mouth daily., Disp: 90 tablet, Rfl: 3   Clascoterone  (WINLEVI ) 1 % CREA, Apply topically to affected areas of chin twice daily for acne (Patient not taking: Reported on 04/30/2024), Disp: 60 g, Rfl: 6   ketoconazole  (NIZORAL ) 2 % shampoo, apply topically x 3 times weekly to scalp and massage into scalp and leave in for 3 - 5 minutes before rinsing out. Can also use 3 x weekly as a face wash to aa of face, Disp: 120 mL, Rfl: 11  "

## 2024-10-18 NOTE — Progress Notes (Signed)
 Patient doing good; having some left leg pain but no other new or acute concerns at this time.

## 2025-02-11 ENCOUNTER — Ambulatory Visit: Admitting: Dermatology

## 2025-02-20 ENCOUNTER — Encounter

## 2025-04-22 ENCOUNTER — Inpatient Hospital Stay: Admitting: Oncology
